# Patient Record
Sex: Male | Born: 1970 | Marital: Married
Health system: Southern US, Community
[De-identification: ages and names within clinical notes are randomized; demographics above are authoritative.]

## PROBLEM LIST (undated history)

## (undated) DIAGNOSIS — J9811 Atelectasis: Secondary | ICD-10-CM

## (undated) DIAGNOSIS — A419 Sepsis, unspecified organism: Secondary | ICD-10-CM

## (undated) DIAGNOSIS — J181 Lobar pneumonia, unspecified organism: Secondary | ICD-10-CM

## (undated) DIAGNOSIS — R6520 Severe sepsis without septic shock: Secondary | ICD-10-CM

## (undated) DIAGNOSIS — G7100 Muscular dystrophy, unspecified: Secondary | ICD-10-CM

## (undated) DIAGNOSIS — J9621 Acute and chronic respiratory failure with hypoxia: Secondary | ICD-10-CM

---

## 2018-04-30 ENCOUNTER — Inpatient Hospital Stay
Admission: RE | Admit: 2018-04-30 | Discharge: 2018-05-27 | Disposition: A | Discharge status: Home Health | Payer: Medicare Other | Source: Ambulatory Visit | Attending: Internal Medicine | Admitting: Internal Medicine

## 2018-04-30 ENCOUNTER — Other Ambulatory Visit (HOSPITAL_COMMUNITY): Payer: Self-pay

## 2018-04-30 DIAGNOSIS — T85598A Other mechanical complication of other gastrointestinal prosthetic devices, implants and grafts, initial encounter: Secondary | ICD-10-CM

## 2018-04-30 DIAGNOSIS — A419 Sepsis, unspecified organism: Secondary | ICD-10-CM | POA: Diagnosis present

## 2018-04-30 DIAGNOSIS — J9690 Respiratory failure, unspecified, unspecified whether with hypoxia or hypercapnia: Secondary | ICD-10-CM

## 2018-04-30 DIAGNOSIS — J90 Pleural effusion, not elsewhere classified: Secondary | ICD-10-CM

## 2018-04-30 DIAGNOSIS — J9621 Acute and chronic respiratory failure with hypoxia: Secondary | ICD-10-CM | POA: Diagnosis present

## 2018-04-30 DIAGNOSIS — R6520 Severe sepsis without septic shock: Secondary | ICD-10-CM

## 2018-04-30 DIAGNOSIS — K567 Ileus, unspecified: Secondary | ICD-10-CM

## 2018-04-30 DIAGNOSIS — Z931 Gastrostomy status: Secondary | ICD-10-CM

## 2018-04-30 DIAGNOSIS — J9811 Atelectasis: Secondary | ICD-10-CM

## 2018-04-30 DIAGNOSIS — J181 Lobar pneumonia, unspecified organism: Secondary | ICD-10-CM | POA: Diagnosis present

## 2018-04-30 DIAGNOSIS — J189 Pneumonia, unspecified organism: Secondary | ICD-10-CM

## 2018-04-30 DIAGNOSIS — G7100 Muscular dystrophy, unspecified: Secondary | ICD-10-CM | POA: Diagnosis present

## 2018-04-30 DIAGNOSIS — J111 Influenza due to unidentified influenza virus with other respiratory manifestations: Secondary | ICD-10-CM

## 2018-04-30 HISTORY — DX: Sepsis, unspecified organism: A41.9

## 2018-04-30 HISTORY — DX: Muscular dystrophy, unspecified: G71.00

## 2018-04-30 HISTORY — DX: Atelectasis: J98.11

## 2018-04-30 HISTORY — DX: Sepsis, unspecified organism: R65.20

## 2018-04-30 HISTORY — DX: Lobar pneumonia, unspecified organism: J18.1

## 2018-04-30 HISTORY — DX: Acute and chronic respiratory failure with hypoxia: J96.21

## 2018-04-30 MED ORDER — IOPAMIDOL (ISOVUE-300) INJECTION 61%
INTRAVENOUS | Status: AC
  Administered 2018-04-30: 50 mL via GASTROSTOMY
  Filled 2018-04-30: qty 50

## 2018-05-01 ENCOUNTER — Other Ambulatory Visit (HOSPITAL_COMMUNITY): Payer: Self-pay

## 2018-05-01 ENCOUNTER — Encounter: Payer: Self-pay | Admitting: Internal Medicine

## 2018-05-01 DIAGNOSIS — J9621 Acute and chronic respiratory failure with hypoxia: Secondary | ICD-10-CM | POA: Diagnosis not present

## 2018-05-01 DIAGNOSIS — J9811 Atelectasis: Secondary | ICD-10-CM | POA: Diagnosis not present

## 2018-05-01 DIAGNOSIS — J181 Lobar pneumonia, unspecified organism: Secondary | ICD-10-CM | POA: Diagnosis not present

## 2018-05-01 DIAGNOSIS — A419 Sepsis, unspecified organism: Secondary | ICD-10-CM | POA: Diagnosis present

## 2018-05-01 DIAGNOSIS — R6520 Severe sepsis without septic shock: Secondary | ICD-10-CM

## 2018-05-01 DIAGNOSIS — G7100 Muscular dystrophy, unspecified: Secondary | ICD-10-CM | POA: Diagnosis not present

## 2018-05-01 LAB — CBC WITH DIFFERENTIAL/PLATELET
Abs Immature Granulocytes: 0.06 10*3/uL (ref 0.00–0.07)
BASOS PCT: 0 %
Basophils Absolute: 0 10*3/uL (ref 0.0–0.1)
EOS ABS: 0.3 10*3/uL (ref 0.0–0.5)
Eosinophils Relative: 3 %
HCT: 40.9 % (ref 39.0–52.0)
Hemoglobin: 13 g/dL (ref 13.0–17.0)
Immature Granulocytes: 1 %
Lymphocytes Relative: 15 %
Lymphs Abs: 1.4 10*3/uL (ref 0.7–4.0)
MCH: 29.9 pg (ref 26.0–34.0)
MCHC: 31.8 g/dL (ref 30.0–36.0)
MCV: 94 fL (ref 80.0–100.0)
Monocytes Absolute: 0.7 10*3/uL (ref 0.1–1.0)
Monocytes Relative: 7 %
Neutro Abs: 7 10*3/uL (ref 1.7–7.7)
Neutrophils Relative %: 74 %
PLATELETS: 354 10*3/uL (ref 150–400)
RBC: 4.35 MIL/uL (ref 4.22–5.81)
RDW: 13.1 % (ref 11.5–15.5)
WBC: 9.5 10*3/uL (ref 4.0–10.5)
nRBC: 0 % (ref 0.0–0.2)

## 2018-05-01 LAB — COMPREHENSIVE METABOLIC PANEL
ALT: 29 U/L (ref 0–44)
AST: 19 U/L (ref 15–41)
Albumin: 3 g/dL — ABNORMAL LOW (ref 3.5–5.0)
Alkaline Phosphatase: 76 U/L (ref 38–126)
Anion gap: 15 (ref 5–15)
BUN: 6 mg/dL (ref 6–20)
CHLORIDE: 103 mmol/L (ref 98–111)
CO2: 20 mmol/L — ABNORMAL LOW (ref 22–32)
Calcium: 9.2 mg/dL (ref 8.9–10.3)
Creatinine, Ser: 0.41 mg/dL — ABNORMAL LOW (ref 0.61–1.24)
GFR calc Af Amer: >60 mL/min (ref >60)
GFR calc non Af Amer: >60 mL/min (ref >60)
Glucose, Bld: 123 mg/dL — ABNORMAL HIGH (ref 70–99)
Potassium: 3.8 mmol/L (ref 3.5–5.1)
Sodium: 138 mmol/L (ref 135–145)
Total Bilirubin: 1.4 mg/dL — ABNORMAL HIGH (ref 0.3–1.2)
Total Protein: 6.6 g/dL (ref 6.5–8.1)

## 2018-05-01 LAB — C DIFFICILE QUICK SCREEN W PCR REFLEX
C Diff antigen: NEGATIVE
C Diff interpretation: NOT DETECTED
C Diff toxin: NEGATIVE

## 2018-05-01 LAB — BLOOD GAS, ARTERIAL
Acid-base deficit: 3.1 mmol/L — ABNORMAL HIGH (ref 0.0–2.0)
Bicarbonate: 20.4 mmol/L (ref 20.0–28.0)
Drawn by: 347191
FIO2: 0.3
MECHVT: 380 mL
O2 Saturation: 98.1 %
PEEP: 5 cmH2O
Patient temperature: 98.6
RATE: 14 resp/min
pCO2 arterial: 30.5 mmHg — ABNORMAL LOW (ref 32.0–48.0)
pH, Arterial: 7.44 (ref 7.350–7.450)
pO2, Arterial: 104 mmHg (ref 83.0–108.0)

## 2018-05-01 LAB — PROTIME-INR
INR: 1.18
Prothrombin Time: 14.9 seconds (ref 11.4–15.2)

## 2018-05-01 LAB — MAGNESIUM: MAGNESIUM: 2.1 mg/dL (ref 1.7–2.4)

## 2018-05-01 LAB — PHOSPHORUS: PHOSPHORUS: 3.2 mg/dL (ref 2.5–4.6)

## 2018-05-01 MED ORDER — FIRST-LANSOPRAZOLE 3 MG/ML PO SUSP
30.00 | ORAL | Status: DC
Start: 2018-05-01 — End: 2018-05-01

## 2018-05-01 MED ORDER — GENERIC EXTERNAL MEDICATION
650.00 | Status: DC
Start: ? — End: 2018-05-01

## 2018-05-01 MED ORDER — PHENOL 1.4 % MT LIQD
2.00 | OROMUCOSAL | Status: DC
Start: ? — End: 2018-05-01

## 2018-05-01 MED ORDER — ALUM & MAG HYDROXIDE-SIMETH 200-200-20 MG/5ML PO SUSP
30.00 | ORAL | Status: DC
Start: ? — End: 2018-05-01

## 2018-05-01 MED ORDER — GENERIC EXTERNAL MEDICATION
15.00 | Status: DC
Start: ? — End: 2018-05-01

## 2018-05-01 MED ORDER — NITROGLYCERIN 0.4 MG SL SUBL
.40 | SUBLINGUAL_TABLET | SUBLINGUAL | Status: DC
Start: ? — End: 2018-05-01

## 2018-05-01 MED ORDER — MORPHINE SULFATE (PF) 4 MG/ML IV SOLN
4.00 | INTRAVENOUS | Status: DC
Start: ? — End: 2018-05-01

## 2018-05-01 MED ORDER — ENOXAPARIN SODIUM 40 MG/0.4ML ~~LOC~~ SOLN
40.00 | SUBCUTANEOUS | Status: DC
Start: 2018-04-29 — End: 2018-05-01

## 2018-05-01 MED ORDER — HYDRALAZINE HCL 20 MG/ML IJ SOLN
10.00 | INTRAMUSCULAR | Status: DC
Start: ? — End: 2018-05-01

## 2018-05-01 MED ORDER — DEXTROSE IN LACTATED RINGERS 5 % IV SOLN
50.00 | INTRAVENOUS | Status: DC
Start: ? — End: 2018-05-01

## 2018-05-01 MED ORDER — INSULIN LISPRO 100 UNIT/ML ~~LOC~~ SOLN
1.00 | SUBCUTANEOUS | Status: DC
Start: 2018-04-30 — End: 2018-05-01

## 2018-05-01 MED ORDER — POLYETHYLENE GLYCOL 3350 17 G PO PACK
17.00 | PACK | ORAL | Status: DC
Start: 2018-05-01 — End: 2018-05-01

## 2018-05-01 MED ORDER — GENERIC EXTERNAL MEDICATION
10.00 | Status: DC
Start: ? — End: 2018-05-01

## 2018-05-01 MED ORDER — INSULIN GLARGINE 100 UNIT/ML ~~LOC~~ SOLN
1.00 | SUBCUTANEOUS | Status: DC
Start: 2018-04-30 — End: 2018-05-01

## 2018-05-01 MED ORDER — LORAZEPAM 2 MG/ML IJ SOLN
.50 | INTRAMUSCULAR | Status: DC
Start: ? — End: 2018-05-01

## 2018-05-01 MED ORDER — GENERIC EXTERNAL MEDICATION
1.00 | Status: DC
Start: ? — End: 2018-05-01

## 2018-05-01 MED ORDER — LOPERAMIDE HCL 2 MG PO CAPS
2.00 | ORAL_CAPSULE | ORAL | Status: DC
Start: ? — End: 2018-05-01

## 2018-05-01 MED ORDER — HYDROCORTISONE 1 % EX CREA
TOPICAL_CREAM | CUTANEOUS | Status: DC
Start: 2018-04-28 — End: 2018-05-01

## 2018-05-01 MED ORDER — ALBUTEROL SULFATE (2.5 MG/3ML) 0.083% IN NEBU
2.50 | INHALATION_SOLUTION | RESPIRATORY_TRACT | Status: DC
Start: ? — End: 2018-05-01

## 2018-05-01 MED ORDER — GUAIFENESIN 400 MG PO TABS
400.00 | ORAL_TABLET | ORAL | Status: DC
Start: ? — End: 2018-05-01

## 2018-05-01 MED ORDER — SODIUM CHLORIDE 0.9 % IV SOLN
10.00 | INTRAVENOUS | Status: DC
Start: ? — End: 2018-05-01

## 2018-05-01 MED ORDER — LACTULOSE 10 GM/15ML PO SOLN
30.00 | ORAL | Status: DC
Start: 2018-04-30 — End: 2018-05-01

## 2018-05-01 MED ORDER — ACETAMINOPHEN 325 MG PO TABS
650.00 | ORAL_TABLET | ORAL | Status: DC
Start: ? — End: 2018-05-01

## 2018-05-01 MED ORDER — INSULIN GLARGINE 100 UNIT/ML ~~LOC~~ SOLN
1.00 | SUBCUTANEOUS | Status: DC
Start: ? — End: 2018-05-01

## 2018-05-01 MED ORDER — DOCUSATE SODIUM 150 MG/15ML PO LIQD
100.00 | ORAL | Status: DC
Start: 2018-05-01 — End: 2018-05-01

## 2018-05-01 MED ORDER — MORPHINE SULFATE (PF) 2 MG/ML IV SOLN
2.00 | INTRAVENOUS | Status: DC
Start: ? — End: 2018-05-01

## 2018-05-01 MED ORDER — LABETALOL HCL 5 MG/ML IV SOLN
20.00 | INTRAVENOUS | Status: DC
Start: ? — End: 2018-05-01

## 2018-05-01 MED ORDER — CITALOPRAM HYDROBROMIDE 20 MG PO TABS
10.00 | ORAL_TABLET | ORAL | Status: DC
Start: 2018-04-29 — End: 2018-05-01

## 2018-05-01 MED ORDER — POLYETHYLENE GLYCOL 3350 17 G PO PACK
17.00 | PACK | ORAL | Status: DC
Start: ? — End: 2018-05-01

## 2018-05-01 MED ORDER — PRAVASTATIN SODIUM 20 MG PO TABS
20.00 | ORAL_TABLET | ORAL | Status: DC
Start: 2018-04-29 — End: 2018-05-01

## 2018-05-01 MED ORDER — INSULIN LISPRO 100 UNIT/ML ~~LOC~~ SOLN
1.00 | SUBCUTANEOUS | Status: DC
Start: ? — End: 2018-05-01

## 2018-05-01 MED ORDER — THERA PO TABS
1.00 | ORAL_TABLET | ORAL | Status: DC
Start: 2018-04-29 — End: 2018-05-01

## 2018-05-01 MED ORDER — HYDROMORPHONE HCL 1 MG/ML IJ SOLN
1.00 | INTRAMUSCULAR | Status: DC
Start: ? — End: 2018-05-01

## 2018-05-01 NOTE — Consult Note (Signed)
[facility]  Date of Service: 05/01/2018  PULMONARY CRITICAL CARE CONSULT   Joseph ShockDarrin L Hampton  WJX:[id number]  DOB: 26-Nov-1970   DOA: 04/30/2018  Referring Physician: Carron CurieAli Hijazi, MD  HPI: Joseph Hampton is a 48 y.o. male seen for follow up of Acute on Chronic Respiratory Failure.  Patient has a longstanding history of muscular dystrophy hyperlipidemia hypertension who presented to the outside hospital with complaints of increasing fever chills cough congestion and bringing up of yellow sputum.  He had been ill for approximately 3 days prior.  Should be noted that he does have a home ventilator that he uses.  He is never had a tracheostomy before he is never been intubated before according to his wife.  Patient has been using the Avva's at home when he sleeps to help with his respiratory muscles.  At the time of presentation he was nontoxic-appearing had low-grade fever noted at that time.  He was initially on 10 L nasal cannula however desaturated to the 70s and therefore had to be intubated.  His hospital course was as follows.  He apparently was found to have atelectasis of the left lung.  Also he had gram-negative pneumonia which he was treated for.  Patient had a tracheostomy done.  This was done for anticipated prolonged mechanical ventilation in addition they did place a PEG tube at that time.  At the time that he is seen he is doing well.  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  Review of Systems:  ROS performed and is unremarkable other than noted above.  Past Medical History:  Diagnosis Date  . Hyperlipidemia  . Hypertension  . MD (muscular dystrophy) (*)  . Psoriasis  . Tachycardia   Past Surgical History:  Procedure Laterality Date  . Colonoscopy  [facility]  . Femur fx  . Male torsion  . Muscle biopsy   . Nerve graph  . Tonsillectomy  . Upper gastrointestinal endoscopy 2014   Family History  Problem Relation Age of Onset  . Hyperlipidemia Mother  . Hypertension Mother  . Heart disease Father   Social History   Socioeconomic History  . Marital status: Married  Spouse name: Not on file  . Number of children: Not on file  . Years of education: Not on file  . Highest education level: Not on file  Occupational History  . Not on file  Social Needs  . Financial resource strain: Not on file  . Food insecurity  Worry: Not on file  Inability: Not on file  . Transportation needs  Medical: Not on file  Non-medical: Not on file  Tobacco Use  . Smoking status: Never Smoker  . Smokeless tobacco: Never Used  Substance and Sexual Activity  . Alcohol use: No  . Drug use: No    Allergies erythromycin  Medications: Reviewed on Rounds  Physical Exam:  Vitals: Temperature 97.0 pulse 81 respiratory 26 blood pressure 143/83 saturations 98%  Ventilator Settings mode of ventilation pressure support FiO2 28% tidal volume 326 PEEP 5 pressure support 12  . General: Comfortable at this time . Eyes: Grossly normal lids, irises & conjunctiva . ENT: grossly tongue is normal . Neck: no obvious mass . Cardiovascular: S1-S2 normal no gallop or rub . Respiratory: No rhonchi expansion equal . Abdomen: Soft nontender . Skin:  no rash seen on limited exam . Musculoskeletal: not rigid . Psychiatric:unable to assess . Neurologic: no seizure no involuntary movements         Labs on Admission:  Basic Metabolic Panel: Recent Labs  Lab 05/01/18 0545  NA 138  K 3.8  CL 103  CO2 20*  GLUCOSE 123*  BUN 6  CREATININE 0.41*  CALCIUM 9.2  MG 2.1  PHOS 3.2    Recent Labs  Lab 05/01/18 0505  PHART 7.440  PCO2ART 30.5*  PO2ART 104  HCO3 20.4  O2SAT 98.1    Liver Function Tests: Recent Labs  Lab 05/01/18 0545  AST 19  ALT 29  ALKPHOS 76  BILITOT 1.4*  PROT 6.6  ALBUMIN 3.0*    No results for input(s): LIPASE, AMYLASE in the last 168 hours. No results for input(s): AMMONIA in the last 168 hours.  CBC: Recent Labs  Lab 05/01/18 0545  WBC 9.5  NEUTROABS 7.0  HGB 13.0  HCT 40.9  MCV 94.0  PLT 354    Cardiac Enzymes: No results for input(s): CKTOTAL, CKMB, CKMBINDEX, TROPONINI in the last 168 hours.  BNP (last 3 results) No results for input(s): BNP in the last 8760 hours.  ProBNP (last 3 results) No results for input(s): PROBNP in the last 8760 hours.   Radiological Exams on Admission: Dg Abdomen Peg Tube Location  Result Date: 04/30/2018 CLINICAL DATA:  Peg tube placement EXAM: ABDOMEN - 1 VIEW COMPARISON:  12/30/2014 FINDINGS: Contrast was injected through the gastrostomy tube and an image was obtained. Contrast opacifies the gastric lumen, duodenal sweep and proximal jejunal loops. Intragastric position of the PEG tube is confirmed. No contrast extravasation. IMPRESSION: Peg tube is located within the distal stomach. Electronically Signed   By: Ulyses SouthwardMark  Boles M.D.   On: 04/30/2018 17:45   Dg Chest Port 1 View  Result Date: 05/01/2018 CLINICAL DATA:  Respiratory failure EXAM: PORTABLE CHEST 1 VIEW COMPARISON:  None. FINDINGS: Tracheostomy tube in place. There is extensive artifact from hardware. Right IJ line with tip at the upper right atrium. Low volume chest with marked elevation of the left diaphragm with subjacent gas distended stomach (stomach based on KUB yesterday). Haziness at the left base. No visible pneumothorax. Cardiopericardial enlargement. IMPRESSION: 1. Low volume chest with asymmetric volume loss on the left where there could be lobar collapse. On outside chest CT 04/27/2018 there was left lower lobe collapse. 2. Cardiopericardial enlargement. 3. Gaseous distention of the stomach. Electronically Signed   By: Marnee SpringJonathon  Watts M.D.   On: 05/01/2018 07:41    Assessment/Plan Active Problems:   Acute on chronic respiratory failure with  hypoxia (HCC)   Lobar pneumonia (HCC)   Atelectasis of left lung   Severe sepsis (HCC)   Muscular dystrophy (HCC)   1. Acute on chronic respiratory failure with hypoxia at this time patient has been started on a wean he is weaning on pressure support mode and currently is requiring 28% FiO2.  We will continue to advance his wean as tolerated.  Ultimately the goal is to get him back to his baseline however I am not sure if he has had underlying progression of his muscular distal atrophy during this illness which may make it more difficult for him to wean. 2. Lobar pneumonia patient has had lobar pneumonia which was treated with antibiotics we will continue to monitor radiologically.  Last chest x-ray showed low volumes with some volume loss on the left side he has had prior atelectasis. 3.  Atelectasis of the left lower lobe patient has had bronchoscopies multiple times because of this we will need to continue to monitor and keep a close eye on this. 4. Sepsis with sepsis Hampton hemodynamically he is stable at this time we will continue with supportive care. 5. Muscular dystrophy physical therapy as tolerated overall prognosis is guarded  I have personally seen and evaluated the patient, evaluated laboratory and imaging results, formulated the assessment and plan and placed orders. The Patient requires high complexity decision making for assessment and support.  Case was discussed on Rounds with the Respiratory Therapy Staff Time Spent  Yevonne Pax, MD [facility] Pulmonary Critical Care Medicine Sleep Medicine

## 2018-05-02 ENCOUNTER — Other Ambulatory Visit (HOSPITAL_COMMUNITY): Payer: Self-pay

## 2018-05-02 DIAGNOSIS — J9621 Acute and chronic respiratory failure with hypoxia: Secondary | ICD-10-CM | POA: Diagnosis not present

## 2018-05-02 DIAGNOSIS — J181 Lobar pneumonia, unspecified organism: Secondary | ICD-10-CM | POA: Diagnosis not present

## 2018-05-02 DIAGNOSIS — J9811 Atelectasis: Secondary | ICD-10-CM | POA: Diagnosis not present

## 2018-05-02 DIAGNOSIS — G7100 Muscular dystrophy, unspecified: Secondary | ICD-10-CM | POA: Diagnosis not present

## 2018-05-02 NOTE — Progress Notes (Signed)
Pulmonary Critical Care Medicine [facility] [place]   PULMONARY CRITICAL CARE SERVICE  PROGRESS NOTE  Date of Service: 05/02/2018  Joseph Hampton  [id number]  DOB: 17-Jul-1970   DOA: 04/30/2018  Referring Physician: Carron Curie, MD  HPI: Joseph Hampton is a 48 y.o. male seen for follow up of Acute on Chronic Respiratory Failure.  Patient is on pressure support mode at this time.  He is actually doing fairly well.  I had a very lengthy discussion with the patient and the patient's wife.  They are very realistic and expectations and understand that he will need ongoing nocturnal support.  The patient had already been on a AVAPS at home and they are now ready to use that by the tracheostomy.  It would be better to leave the tracheostomy in place because of his secretion concerns  Medications: Reviewed on Rounds  Physical Exam:  Vitals: Temperature 97.7 pulse 62 respiratory 19 blood pressure 154/86 saturations 98%  Ventilator Settings currently on pressure support FiO2 28% tidal volume 310 pressure support 12 PEEP 5  . General: Comfortable at this time . Eyes: Grossly normal lids, irises & conjunctiva . ENT: grossly tongue is normal . Neck: no obvious mass . Cardiovascular: S1 S2 normal no gallop . Respiratory: Coarse rhonchi are noted bilaterally . Abdomen: soft . Skin: no rash seen on limited exam . Musculoskeletal: not rigid . Psychiatric:unable to assess . Neurologic: no seizure no involuntary movements         Lab Data:   Basic Metabolic Panel: Recent Labs  Lab 05/01/18 0545  NA 138  K 3.8  CL 103  CO2 20*  GLUCOSE 123*  BUN 6  CREATININE 0.41*  CALCIUM 9.2  MG 2.1  PHOS 3.2    ABG: Recent Labs  Lab 05/01/18 0505  PHART 7.440  PCO2ART 30.5*  PO2ART 104  HCO3 20.4  O2SAT 98.1    Liver Function Tests: Recent Labs  Lab 05/01/18 0545  AST 19  ALT 29  ALKPHOS 76  BILITOT 1.4*  PROT 6.6  ALBUMIN 3.0*   No results for input(s): LIPASE,  AMYLASE in the last 168 hours. No results for input(s): AMMONIA in the last 168 hours.  CBC: Recent Labs  Lab 05/01/18 0545  WBC 9.5  NEUTROABS 7.0  HGB 13.0  HCT 40.9  MCV 94.0  PLT 354    Cardiac Enzymes: No results for input(s): CKTOTAL, CKMB, CKMBINDEX, TROPONINI in the last 168 hours.  BNP (last 3 results) No results for input(s): BNP in the last 8760 hours.  ProBNP (last 3 results) No results for input(s): PROBNP in the last 8760 hours.  Radiological Exams: Dg Abdomen Peg Tube Location  Result Date: 04/30/2018 CLINICAL DATA:  Peg tube placement EXAM: ABDOMEN - 1 VIEW COMPARISON:  12/30/2014 FINDINGS: Contrast was injected through the gastrostomy tube and an image was obtained. Contrast opacifies the gastric lumen, duodenal sweep and proximal jejunal loops. Intragastric position of the PEG tube is confirmed. No contrast extravasation. IMPRESSION: Peg tube is located within the distal stomach. Electronically Signed   By: Ulyses Southward M.D.   On: 04/30/2018 17:45   Dg Chest Port 1 View  Result Date: 05/01/2018 CLINICAL DATA:  Respiratory failure EXAM: PORTABLE CHEST 1 VIEW COMPARISON:  None. FINDINGS: Tracheostomy tube in place. There is extensive artifact from hardware. Right IJ line with tip at the upper right atrium. Low volume chest with marked elevation of the left diaphragm with subjacent gas distended stomach (stomach based  on KUB yesterday). Haziness at the left base. No visible pneumothorax. Cardiopericardial enlargement. IMPRESSION: 1. Low volume chest with asymmetric volume loss on the left where there could be lobar collapse. On outside chest CT 04/27/2018 there was left lower lobe collapse. 2. Cardiopericardial enlargement. 3. Gaseous distention of the stomach. Electronically Signed   By: Marnee SpringJonathon  Watts M.D.   On: 05/01/2018 07:41   Dg Abd Portable 1v  Result Date: 05/02/2018 CLINICAL DATA:  Ileus EXAM: PORTABLE ABDOMEN - 1 VIEW COMPARISON:  04/30/2018 abdominal  radiograph FINDINGS: Tip of percutaneous gastrostomy tube overlies the body of the stomach. Mild gaseous distention of the visualized stomach. No dilated small bowel loops. Enteric contrast is noted throughout the colon and rectum. Moderate diffuse gaseous distention of the large bowel. No evidence of pneumatosis or pneumoperitoneum. No radiopaque nephrolithiasis. IMPRESSION: 1. Tip of percutaneous gastrostomy tube overlies the body of the stomach. Mild gaseous distention of the stomach. 2. Nonobstructive bowel gas pattern. 3. Moderate diffuse gaseous distention of the large bowel. Enteric contrast is seen in the large bowel and rectum. No evidence of free air on these supine views. Electronically Signed   By: Delbert PhenixJason A Poff M.D.   On: 05/02/2018 08:32    Assessment/Plan Active Problems:   Acute on chronic respiratory failure with hypoxia (HCC)   Lobar pneumonia (HCC)   Atelectasis of left lung   Severe sepsis (HCC)   Muscular dystrophy (HCC)   1. Acute on chronic respiratory failure with hypoxia we will continue with weaning on pressure support patient is tolerating it well.  Secretions are improving patient was started on Mucomyst. 2. Lobar pneumonia treated clinically improved. 3. Atelectasis of the lung.  He has a large amount of gas in his belly and colon and stomach which is pushing up against his right diaphragm so therefore the left lung is hypoexpanded.  We will continue with the Mucomyst continue with chest PT which was requested today 4. Severe sepsis resolved 5. Muscular dystrophy patient has progressive disease we will continue with supportive care   I have personally seen and evaluated the patient, evaluated laboratory and imaging results, formulated the assessment and plan and placed orders.  Time 35 minutes extensive discussion with the patient and family The Patient requires high complexity decision making for assessment and support.  Case was discussed on Rounds with the  Respiratory Therapy Staff  Yevonne PaxSaadat A , MD [facility] Pulmonary Critical Care Medicine Sleep Medicine

## 2018-05-03 DIAGNOSIS — G7100 Muscular dystrophy, unspecified: Secondary | ICD-10-CM | POA: Diagnosis not present

## 2018-05-03 DIAGNOSIS — J9621 Acute and chronic respiratory failure with hypoxia: Secondary | ICD-10-CM | POA: Diagnosis not present

## 2018-05-03 DIAGNOSIS — J9811 Atelectasis: Secondary | ICD-10-CM | POA: Diagnosis not present

## 2018-05-03 DIAGNOSIS — J181 Lobar pneumonia, unspecified organism: Secondary | ICD-10-CM | POA: Diagnosis not present

## 2018-05-03 NOTE — Progress Notes (Signed)
[facility]   PULMONARY CRITICAL CARE SERVICE  PROGRESS NOTE  Date of Service: 05/03/2018  Joseph Hampton  [id number]  DOB: 08/23/1970   DOA: 04/30/2018  Referring Physician: Carron CurieAli Hijazi, MD  HPI: Joseph Hampton is a 48 y.o. male seen for follow up of Acute on Chronic Respiratory Failure.  Patient at this time is weaning on pressure support the goal is for approximately 12 hours  Medications: Reviewed on Rounds  Physical Exam:  Vitals: Temperature 97.4 pulse 100 respiratory rate 30 blood pressure 106/68 saturations 98%  Ventilator Settings mode ventilation pressure support FiO2 28% tidal volume 400 pressure 12 PEEP 5  . General: Comfortable at this time . Eyes: Grossly normal lids, irises & conjunctiva . ENT: grossly tongue is normal . Neck: no obvious mass . Cardiovascular: S1 S2 normal no gallop . Respiratory: No rhonchi or rales are noted at this time . Abdomen: soft . Skin: no rash seen on limited exam . Musculoskeletal: not rigid . Psychiatric:unable to assess . Neurologic: no seizure no involuntary movements         Lab Data:   Basic Metabolic Panel: Recent Labs  Lab 05/01/18 0545  NA 138  K 3.8  CL 103  CO2 20*  GLUCOSE 123*  BUN 6  CREATININE 0.41*  CALCIUM 9.2  MG 2.1  PHOS 3.2    ABG: Recent Labs  Lab 05/01/18 0505  PHART 7.440  PCO2ART 30.5*  PO2ART 104  HCO3 20.4  O2SAT 98.1    Liver Function Tests: Recent Labs  Lab 05/01/18 0545  AST 19  ALT 29  ALKPHOS 76  BILITOT 1.4*  PROT 6.6  ALBUMIN 3.0*   No results for input(s): LIPASE, AMYLASE in the last 168 hours. No results for input(s): AMMONIA in the last 168 hours.  CBC: Recent Labs  Lab 05/01/18 0545  WBC 9.5  NEUTROABS 7.0  HGB 13.0  HCT 40.9  MCV 94.0  PLT 354    Cardiac Enzymes: No results for input(s): CKTOTAL, CKMB, CKMBINDEX, TROPONINI in the last 168 hours.  BNP (last 3 results) No results for  input(s): BNP in the last 8760 hours.  ProBNP (last 3 results) No results for input(s): PROBNP in the last 8760 hours.  Radiological Exams: Dg Abd Portable 1v  Result Date: 05/02/2018 CLINICAL DATA:  Ileus EXAM: PORTABLE ABDOMEN - 1 VIEW COMPARISON:  04/30/2018 abdominal radiograph FINDINGS: Tip of percutaneous gastrostomy tube overlies the body of the stomach. Mild gaseous distention of the visualized stomach. No dilated small bowel loops. Enteric contrast is noted throughout the colon and rectum. Moderate diffuse gaseous distention of the large bowel. No evidence of pneumatosis or pneumoperitoneum. No radiopaque nephrolithiasis. IMPRESSION: 1. Tip of percutaneous gastrostomy tube overlies the body of the stomach. Mild gaseous distention of the stomach. 2. Nonobstructive bowel gas pattern. 3. Moderate diffuse gaseous distention of the large bowel. Enteric contrast is seen in the large bowel and rectum. No evidence of free air on these supine views. Electronically Signed   By: Delbert PhenixJason A Poff M.D.   On: 05/02/2018 08:32    Assessment/Plan Active Problems:   Acute on chronic respiratory failure with hypoxia (HCC)   Lobar pneumonia (HCC)   Atelectasis of left lung   Severe sepsis (HCC)   Muscular dystrophy (HCC)   1. Acute on chronic respiratory failure with hypoxia we will continue with the wean as scheduled for 12 hours today.  Ultimate goal is to get him on T  collar during the daytime and ventilator at nighttime 2. Lobar pneumonia treated we will continue present management 3. Atelectasis of the lung we will continue to monitor radiologically continue aggressive pulmonary toilet chest PT as well as Mucomyst 4. Severe sepsis at baseline we will continue supportive care hemodynamically stable 5. Muscular dystrophy advanced progressive disease continue to monitor   I have personally seen and evaluated the patient, evaluated laboratory and imaging results, formulated the assessment and plan and  placed orders. The Patient requires high complexity decision making for assessment and support.  Case was discussed on Rounds with the Respiratory Therapy Staff  Yevonne Pax, MD [facility] Pulmonary Critical Care Medicine Sleep Medicine

## 2018-05-04 ENCOUNTER — Other Ambulatory Visit (HOSPITAL_COMMUNITY): Payer: Self-pay

## 2018-05-04 DIAGNOSIS — J181 Lobar pneumonia, unspecified organism: Secondary | ICD-10-CM | POA: Diagnosis not present

## 2018-05-04 DIAGNOSIS — J9811 Atelectasis: Secondary | ICD-10-CM | POA: Diagnosis not present

## 2018-05-04 DIAGNOSIS — J9621 Acute and chronic respiratory failure with hypoxia: Secondary | ICD-10-CM | POA: Diagnosis not present

## 2018-05-04 DIAGNOSIS — G7100 Muscular dystrophy, unspecified: Secondary | ICD-10-CM | POA: Diagnosis not present

## 2018-05-04 NOTE — Progress Notes (Signed)
Pulmonary Critical Care [facility] [place]   PULMONARY CRITICAL CARE SERVICE  PROGRESS NOTE  Date of Service: 05/04/2018  Joseph Hampton  [id number]  DOB: 1970/08/13   DOA: 04/30/2018  Referring Physician: Carron CurieAli Hijazi, MD  HPI: Joseph Hampton is a 48 y.o. male seen for follow up of Acute on Chronic Respiratory Failure.  Patient is on full support right now is supposed to wean today for pressure support of 16 hours  Medications: Reviewed on Rounds  Physical Exam:  Vitals: Temperature 97.0 pulse 76 respiratory 11 blood pressure 114/60 saturations 97%  Ventilator Settings mode ventilation assist control FiO2 28% tidal volume 800 PEEP 5  . General: Comfortable at this time . Eyes: Grossly normal lids, irises & conjunctiva . ENT: grossly tongue is normal . Neck: no obvious mass . Cardiovascular: S1 S2 normal no gallop . Respiratory: No rhonchi no rales are noted at this time . Abdomen: soft . Skin: no rash seen on limited exam . Musculoskeletal: not rigid . Psychiatric:unable to assess . Neurologic: no seizure no involuntary movements         Lab Data:   Basic Metabolic Panel: Recent Labs  Lab 05/01/18 0545  NA 138  K 3.8  CL 103  CO2 20*  GLUCOSE 123*  BUN 6  CREATININE 0.41*  CALCIUM 9.2  MG 2.1  PHOS 3.2    ABG: Recent Labs  Lab 05/01/18 0505  PHART 7.440  PCO2ART 30.5*  PO2ART 104  HCO3 20.4  O2SAT 98.1    Liver Function Tests: Recent Labs  Lab 05/01/18 0545  AST 19  ALT 29  ALKPHOS 76  BILITOT 1.4*  PROT 6.6  ALBUMIN 3.0*   No results for input(s): LIPASE, AMYLASE in the last 168 hours. No results for input(s): AMMONIA in the last 168 hours.  CBC: Recent Labs  Lab 05/01/18 0545  WBC 9.5  NEUTROABS 7.0  HGB 13.0  HCT 40.9  MCV 94.0  PLT 354    Cardiac Enzymes: No results for input(s): CKTOTAL, CKMB, CKMBINDEX, TROPONINI in the last 168 hours.  BNP (last 3 results) No results for input(s): BNP in the  last 8760 hours.  ProBNP (last 3 results) No results for input(s): PROBNP in the last 8760 hours.  Radiological Exams: Dg Abd Portable 1v  Result Date: 05/04/2018 CLINICAL DATA:  Ileus EXAM: PORTABLE ABDOMEN - 1 VIEW COMPARISON:  May 02, 2018 FINDINGS: There is generalized bowel dilatation without appreciable air-fluid levels. No evident free air. Gastrostomy catheter tip is in the region of the stomach. Postoperative changes noted in the proximal left femur. IMPRESSION: Generalized bowel dilatation in a pattern most likely due to ileus, stable. Gastrostomy catheter in region of stomach. No evident free air. Electronically Signed   By: Bretta BangWilliam  Woodruff III M.D.   On: 05/04/2018 07:49    Assessment/Plan Active Problems:   Acute on chronic respiratory failure with hypoxia (HCC)   Lobar pneumonia (HCC)   Atelectasis of left lung   Severe sepsis (HCC)   Muscular dystrophy (HCC)   1. Acute on chronic respiratory failure with hypoxia we will continue to advance As mentioned above patient is going to do pressure support going for 16 hours 2. Lobar pneumonia treated we will continue with supportive care 3. Atelectasis of the lung continue with pulmonary toilet supportive care 4. Severe sepsis resolved 5. Muscular dystrophy severe disease continue with present management therapy as tolerated   I have personally seen and evaluated the patient, evaluated laboratory  and imaging results, formulated the assessment and plan and placed orders. The Patient requires high complexity decision making for assessment and support.  Case was discussed on Rounds with the Respiratory Therapy Staff  Allyne Gee, MD [facility] Pulmonary Critical Care Medicine Sleep Medicine

## 2018-05-05 DIAGNOSIS — J181 Lobar pneumonia, unspecified organism: Secondary | ICD-10-CM | POA: Diagnosis not present

## 2018-05-05 DIAGNOSIS — J9811 Atelectasis: Secondary | ICD-10-CM | POA: Diagnosis not present

## 2018-05-05 DIAGNOSIS — J9621 Acute and chronic respiratory failure with hypoxia: Secondary | ICD-10-CM | POA: Diagnosis not present

## 2018-05-05 DIAGNOSIS — G7100 Muscular dystrophy, unspecified: Secondary | ICD-10-CM | POA: Diagnosis not present

## 2018-05-05 LAB — BASIC METABOLIC PANEL
Anion gap: 11 (ref 5–15)
BUN: 12 mg/dL (ref 6–20)
CO2: 24 mmol/L (ref 22–32)
Calcium: 8.8 mg/dL — ABNORMAL LOW (ref 8.9–10.3)
Chloride: 102 mmol/L (ref 98–111)
Creatinine, Ser: 0.37 mg/dL — ABNORMAL LOW (ref 0.61–1.24)
GFR calc Af Amer: >60 mL/min (ref >60)
GFR calc non Af Amer: >60 mL/min (ref >60)
Glucose, Bld: 93 mg/dL (ref 70–99)
Potassium: 3.1 mmol/L — ABNORMAL LOW (ref 3.5–5.1)
Sodium: 137 mmol/L (ref 135–145)

## 2018-05-05 LAB — CBC
HCT: 36.4 % — ABNORMAL LOW (ref 39.0–52.0)
Hemoglobin: 12.3 g/dL — ABNORMAL LOW (ref 13.0–17.0)
MCH: 30.8 pg (ref 26.0–34.0)
MCHC: 33.8 g/dL (ref 30.0–36.0)
MCV: 91 fL (ref 80.0–100.0)
Platelets: 223 10*3/uL (ref 150–400)
RBC: 4 MIL/uL — AB (ref 4.22–5.81)
RDW: 13.1 % (ref 11.5–15.5)
WBC: 5.8 10*3/uL (ref 4.0–10.5)
nRBC: 0 % (ref 0.0–0.2)

## 2018-05-05 LAB — MAGNESIUM: MAGNESIUM: 1.9 mg/dL (ref 1.7–2.4)

## 2018-05-05 NOTE — Progress Notes (Signed)
Pulmonary Critical Care Medicine [facility] [place]   PULMONARY CRITICAL CARE SERVICE  PROGRESS NOTE  Date of Service: 05/05/2018  Joseph Hampton  IRC:[id number]  DOB: July 18, 1970   DOA: 04/30/2018  Referring Physician: Carron Curie, MD  HPI: Joseph Hampton is a 48 y.o. male seen for follow up of Acute on Chronic Respiratory Failure.  This morning patient was attempted on spontaneous breathing trial but did not go well I placed him on a pressure support of 8/5 which he seemed to tolerated little bit better  Medications: Reviewed on Rounds  Physical Exam:  Vitals: Temperature 98.4 pulse 84 respiratory 22 blood pressure 124/65 saturations 100%  Ventilator Settings mode of ventilation pressure support FiO2 28% tidal volume 311 pressure support 8 PEEP 5  . General: Comfortable at this time . Eyes: Grossly normal lids, irises & conjunctiva . ENT: grossly tongue is normal . Neck: no obvious mass . Cardiovascular: S1 S2 normal no gallop . Respiratory: Coarse breath sounds with a few rhonchi . Abdomen: soft . Skin: no rash seen on limited exam . Musculoskeletal: not rigid . Psychiatric:unable to assess . Neurologic: no seizure no involuntary movements         Lab Data:   Basic Metabolic Panel: Recent Labs  Lab 05/01/18 0545 05/05/18 0541  NA 138 137  K 3.8 3.1*  CL 103 102  CO2 20* 24  GLUCOSE 123* 93  BUN 6 12  CREATININE 0.41* 0.37*  CALCIUM 9.2 8.8*  MG 2.1 1.9  PHOS 3.2  --     ABG: Recent Labs  Lab 05/01/18 0505  PHART 7.440  PCO2ART 30.5*  PO2ART 104  HCO3 20.4  O2SAT 98.1    Liver Function Tests: Recent Labs  Lab 05/01/18 0545  AST 19  ALT 29  ALKPHOS 76  BILITOT 1.4*  PROT 6.6  ALBUMIN 3.0*   No results for input(s): LIPASE, AMYLASE in the last 168 hours. No results for input(s): AMMONIA in the last 168 hours.  CBC: Recent Labs  Lab 05/01/18 0545 05/05/18 0541  WBC 9.5 5.8  NEUTROABS 7.0  --   HGB 13.0 12.3*  HCT 40.9  36.4*  MCV 94.0 91.0  PLT 354 223    Cardiac Enzymes: No results for input(s): CKTOTAL, CKMB, CKMBINDEX, TROPONINI in the last 168 hours.  BNP (last 3 results) No results for input(s): BNP in the last 8760 hours.  ProBNP (last 3 results) No results for input(s): PROBNP in the last 8760 hours.  Radiological Exams: Dg Abd Portable 1v  Result Date: 05/04/2018 CLINICAL DATA:  Ileus EXAM: PORTABLE ABDOMEN - 1 VIEW COMPARISON:  May 02, 2018 FINDINGS: There is generalized bowel dilatation without appreciable air-fluid levels. No evident free air. Gastrostomy catheter tip is in the region of the stomach. Postoperative changes noted in the proximal left femur. IMPRESSION: Generalized bowel dilatation in a pattern most likely due to ileus, stable. Gastrostomy catheter in region of stomach. No evident free air. Electronically Signed   By: Bretta Bang III M.D.   On: 05/04/2018 07:49    Assessment/Plan Active Problems:   Acute on chronic respiratory failure with hypoxia (HCC)   Lobar pneumonia (HCC)   Atelectasis of left lung   Severe sepsis (HCC)   Muscular dystrophy (HCC)   1. Acute on chronic respiratory failure with hypoxia we will continue with the pressure support 8/5 for weaning parameters.  Attempt T-bar if patient is able to tolerate today. 2. Lobar pneumonia treated patient had no fevers  noted. 3. Atelectasis of the lung continue with aggressive pulmonary toilet. 4. Severe sepsis resolved 5. Muscular dystrophy advanced disease we will continue with present management   I have personally seen and evaluated the patient, evaluated laboratory and imaging results, formulated the assessment and plan and placed orders. The Patient requires high complexity decision making for assessment and support.  Case was discussed on Rounds with the Respiratory Therapy Staff  Yevonne PaxSaadat A Keren Alverio, MD [facility] Pulmonary Critical Care Medicine Sleep Medicine

## 2018-05-06 ENCOUNTER — Other Ambulatory Visit (HOSPITAL_COMMUNITY): Payer: Self-pay

## 2018-05-06 DIAGNOSIS — J181 Lobar pneumonia, unspecified organism: Secondary | ICD-10-CM | POA: Diagnosis not present

## 2018-05-06 DIAGNOSIS — G7100 Muscular dystrophy, unspecified: Secondary | ICD-10-CM | POA: Diagnosis not present

## 2018-05-06 DIAGNOSIS — J9811 Atelectasis: Secondary | ICD-10-CM | POA: Diagnosis not present

## 2018-05-06 DIAGNOSIS — J9621 Acute and chronic respiratory failure with hypoxia: Secondary | ICD-10-CM | POA: Diagnosis not present

## 2018-05-06 LAB — BASIC METABOLIC PANEL
Anion gap: 14 (ref 5–15)
BUN: 12 mg/dL (ref 6–20)
CO2: 26 mmol/L (ref 22–32)
Calcium: 9.3 mg/dL (ref 8.9–10.3)
Chloride: 97 mmol/L — ABNORMAL LOW (ref 98–111)
Creatinine, Ser: 0.42 mg/dL — ABNORMAL LOW (ref 0.61–1.24)
GFR calc Af Amer: >60 mL/min (ref >60)
GFR calc non Af Amer: >60 mL/min (ref >60)
Glucose, Bld: 100 mg/dL — ABNORMAL HIGH (ref 70–99)
POTASSIUM: 3 mmol/L — AB (ref 3.5–5.1)
SODIUM: 137 mmol/L (ref 135–145)

## 2018-05-06 LAB — CBC
HCT: 41 % (ref 39.0–52.0)
Hemoglobin: 13.6 g/dL (ref 13.0–17.0)
MCH: 30.8 pg (ref 26.0–34.0)
MCHC: 33.2 g/dL (ref 30.0–36.0)
MCV: 92.8 fL (ref 80.0–100.0)
Platelets: 249 10*3/uL (ref 150–400)
RBC: 4.42 MIL/uL (ref 4.22–5.81)
RDW: 13.1 % (ref 11.5–15.5)
WBC: 6.4 10*3/uL (ref 4.0–10.5)
nRBC: 0 % (ref 0.0–0.2)

## 2018-05-06 NOTE — Progress Notes (Signed)
Pulmonary Critical Care Medicine [place]   PULMONARY CRITICAL CARE SERVICE  PROGRESS NOTE  Date of Service: 05/06/2018  Joseph Hampton  SFS:[id number]  DOB: [birth year]   DOA: 04/30/2018  Referring Physician: Merton Border, MD  HPI: Joseph Hampton is a 48 y.o. male seen for follow up of Acute on Chronic Respiratory Failure.  Currently patient is on T collar he is goal today was about 4 hours which he completed.  He did comfortably without distress  Medications: Reviewed on Rounds  Physical Exam:  Vitals: Temperature 97.6 pulse 77 respiratory 20 blood pressure 104/77 saturations 100%  Ventilator Settings off the ventilator on T collar  . General: Comfortable at this time . Eyes: Grossly normal lids, irises & conjunctiva . ENT: grossly tongue is normal . Neck: no obvious mass . Cardiovascular: S1 S2 normal no gallop . Respiratory: No rhonchi or rales are noted at this time . Abdomen: soft . Skin: no rash seen on limited exam . Musculoskeletal: not rigid . Psychiatric:unable to assess . Neurologic: no seizure no involuntary movements         Lab Data:   Basic Metabolic Panel: Recent Labs  Lab 05/01/18 0545 05/05/18 0541 05/06/18 0604  NA 138 137 137  K 3.8 3.1* 3.0*  CL 103 102 97*  CO2 20* 24 26  GLUCOSE 123* 93 100*  BUN _0 CREATININE 0.41* 0.37* 0.42*  CALCIUM 9.2 8.8* 9.3  MG 2.1 1.9  --   PHOS 3.2  --   --     ABG: Recent Labs  Lab 05/01/18 0505  PHART 7.440  PCO2ART 30.5*  PO2ART 104  HCO3 20.4  O2SAT 98.1    Liver Function Tests: Recent Labs  Lab 05/01/18 0545  AST 19  ALT 29  ALKPHOS 76  BILITOT 1.4*  PROT 6.6  ALBUMIN 3.0*   No results for input(s): LIPASE, AMYLASE in the last 168 hours. No results for input(s): AMMONIA in the last 168 hours.  CBC: Recent Labs  Lab 05/01/18 0545 05/05/18 0541 05/06/18 0604  WBC 9.5 5.8 6.4  NEUTROABS 7.0  --   --   HGB 13.0 12.3* 13.6  HCT 40.9 36.4* 41.0  MCV 94.0  91.0 92.8  PLT 354 223 249    Cardiac Enzymes: No results for input(s): CKTOTAL, CKMB, CKMBINDEX, TROPONINI in the last 168 hours.  BNP (last 3 results) No results for input(s): BNP in the last 8760 hours.  ProBNP (last 3 results) No results for input(s): PROBNP in the last 8760 hours.  Radiological Exams: Dg Chest Port 1 View  Result Date: 05/06/2018 CLINICAL DATA:  Collapse of left lung. EXAM: PORTABLE CHEST 1 VIEW COMPARISON:  One-view chest x-ray 05/01/2018 FINDINGS: Heart is enlarged. Tracheostomy tube is in place. Right IJ line is in place. There is improving aeration at the left base. Gastric or colonic distention previously noted has resolved. Left basilar airspace disease, likely atelectasis is improved. The right lung base is clear. IMPRESSION: 1. Improving aeration at the left base with decreased elevation of left hemidiaphragm. 2. Persistent but decreasing left base atelectasis and effusion. Electronically Signed   By: San Morelle M.D.   On: 05/06/2018 07:46   Dg Abd Portable 1v  Result Date: 05/06/2018 CLINICAL DATA:  Ileus EXAM: PORTABLE ABDOMEN - 1 VIEW COMPARISON:  05/04/2018 FINDINGS: Gastrostomy tube noted. There is improvement in the air distension of the small bowel. Colonic air distention remains, also slightly less. Sigmoid diverticulosis noted. Negative  for obstruction. No abnormal calcifications. Degenerative changes spine. IMPRESSION: Improvement in gaseous distention of bowel compatible resolving ileus. Electronically Signed   By: Jerilynn Mages.  Shick M.D.   On: 05/06/2018 07:43    Assessment/Plan Active Problems:   Acute on chronic respiratory failure with hypoxia (HCC)   Lobar pneumonia (HCC)   Atelectasis of left lung   Severe sepsis (HCC)   Muscular dystrophy ([place])   1. Acute on chronic respiratory failure with hypoxia we will continue weaning on the T collar.  Today's goal of 4 hours was met 2. Lobar pneumonia treated clinically improved chest x-ray  actually shows improvement in the atelectasis and effusions. 3. Atelectasis of left lung continue with aggressive pulmonary toilet supportive care it is improving on the chest film. 4. Severe sepsis resolved 5. Muscular dystrophy at baseline continue present management   I have personally seen and evaluated the patient, evaluated laboratory and imaging results, formulated the assessment and plan and placed orders. The Patient requires high complexity decision making for assessment and support.  Case was discussed on Rounds with the Respiratory Therapy Staff  Allyne Gee, MD [facility] Pulmonary Critical Care Medicine Sleep Medicine

## 2018-05-07 DIAGNOSIS — J9811 Atelectasis: Secondary | ICD-10-CM | POA: Diagnosis not present

## 2018-05-07 DIAGNOSIS — G7100 Muscular dystrophy, unspecified: Secondary | ICD-10-CM | POA: Diagnosis not present

## 2018-05-07 DIAGNOSIS — J181 Lobar pneumonia, unspecified organism: Secondary | ICD-10-CM | POA: Diagnosis not present

## 2018-05-07 DIAGNOSIS — J9621 Acute and chronic respiratory failure with hypoxia: Secondary | ICD-10-CM | POA: Diagnosis not present

## 2018-05-07 LAB — BASIC METABOLIC PANEL
Anion gap: 11 (ref 5–15)
BUN: 21 mg/dL — AB (ref 6–20)
CO2: 30 mmol/L (ref 22–32)
Calcium: 9.6 mg/dL (ref 8.9–10.3)
Chloride: 97 mmol/L — ABNORMAL LOW (ref 98–111)
Creatinine, Ser: 0.44 mg/dL — ABNORMAL LOW (ref 0.61–1.24)
GFR calc Af Amer: >60 mL/min (ref >60)
GFR calc non Af Amer: >60 mL/min (ref >60)
Glucose, Bld: 129 mg/dL — ABNORMAL HIGH (ref 70–99)
POTASSIUM: 4.1 mmol/L (ref 3.5–5.1)
Sodium: 138 mmol/L (ref 135–145)

## 2018-05-07 LAB — MAGNESIUM: Magnesium: 2.3 mg/dL (ref 1.7–2.4)

## 2018-05-07 NOTE — Progress Notes (Signed)
Pulmonary Critical Care Medicine [facility] [place]   PULMONARY CRITICAL CARE SERVICE  PROGRESS NOTE  Date of Service: 05/07/2018  Joseph Hampton  XYI:[id number]  DOB: 06-19-1970   DOA: 04/30/2018  Referring Physician: Carron Curie, MD  HPI: Joseph Hampton is a 48 y.o. male seen for follow up of Acute on Chronic Respiratory Failure.  He is on T collar today he is doing well.  Yesterday he did about 4 hours today the goal is 8 hours on the T collar.  Currently is on 35% FiO2.  Chest x-ray shows increased density on the left side could be fluid versus atelectasis.  I would recommend doing an ultrasound to evaluate  Medications: Reviewed on Rounds  Physical Exam:  Vitals: Temperature 98.3 pulse 85 respiratory rate 17 blood pressure 114/69 saturations 99%  Ventilator Settings patient is currently on T collar FiO2 35%  . General: Comfortable at this time . Eyes: Grossly normal lids, irises & conjunctiva . ENT: grossly tongue is normal . Neck: no obvious mass . Cardiovascular: S1 S2 normal no gallop . Respiratory: No rhonchi or rales are noted at this time . Abdomen: soft . Skin: no rash seen on limited exam . Musculoskeletal: not rigid . Psychiatric:unable to assess . Neurologic: no seizure no involuntary movements         Lab Data:   Basic Metabolic Panel: Recent Labs  Lab 05/01/18 0545 05/05/18 0541 05/06/18 0604 05/07/18 0602  NA 138 137 137 138  K 3.8 3.1* 3.0* 4.1  CL 103 102 97* 97*  CO2 20* 24 26 30   GLUCOSE 123* 93 100* 129*  BUN 6 12 12  21*  CREATININE 0.41* 0.37* 0.42* 0.44*  CALCIUM 9.2 8.8* 9.3 9.6  MG 2.1 1.9  --  2.3  PHOS 3.2  --   --   --     ABG: Recent Labs  Lab 05/01/18 0505  PHART 7.440  PCO2ART 30.5*  PO2ART 104  HCO3 20.4  O2SAT 98.1    Liver Function Tests: Recent Labs  Lab 05/01/18 0545  AST 19  ALT 29  ALKPHOS 76  BILITOT 1.4*  PROT 6.6  ALBUMIN 3.0*   No results for input(s): LIPASE, AMYLASE in the last 168  hours. No results for input(s): AMMONIA in the last 168 hours.  CBC: Recent Labs  Lab 05/01/18 0545 05/05/18 0541 05/06/18 0604  WBC 9.5 5.8 6.4  NEUTROABS 7.0  --   --   HGB 13.0 12.3* 13.6  HCT 40.9 36.4* 41.0  MCV 94.0 91.0 92.8  PLT 354 223 249    Cardiac Enzymes: No results for input(s): CKTOTAL, CKMB, CKMBINDEX, TROPONINI in the last 168 hours.  BNP (last 3 results) No results for input(s): BNP in the last 8760 hours.  ProBNP (last 3 results) No results for input(s): PROBNP in the last 8760 hours.  Radiological Exams: Dg Chest Port 1 View  Result Date: 05/06/2018 CLINICAL DATA:  Collapse of left lung. EXAM: PORTABLE CHEST 1 VIEW COMPARISON:  One-view chest x-ray 05/01/2018 FINDINGS: Heart is enlarged. Tracheostomy tube is in place. Right IJ line is in place. There is improving aeration at the left base. Gastric or colonic distention previously noted has resolved. Left basilar airspace disease, likely atelectasis is improved. The right lung base is clear. IMPRESSION: 1. Improving aeration at the left base with decreased elevation of left hemidiaphragm. 2. Persistent but decreasing left base atelectasis and effusion. Electronically Signed   By: Marin Roberts M.D.   On:  05/06/2018 07:46   Dg Abd Portable 1v  Result Date: 05/06/2018 CLINICAL DATA:  Ileus EXAM: PORTABLE ABDOMEN - 1 VIEW COMPARISON:  05/04/2018 FINDINGS: Gastrostomy tube noted. There is improvement in the air distension of the small bowel. Colonic air distention remains, also slightly less. Sigmoid diverticulosis noted. Negative for obstruction. No abnormal calcifications. Degenerative changes spine. IMPRESSION: Improvement in gaseous distention of bowel compatible resolving ileus. Electronically Signed   By: Judie Petit.  Shick M.D.   On: 05/06/2018 07:43    Assessment/Plan Active Problems:   Acute on chronic respiratory failure with hypoxia (HCC)   Lobar pneumonia (HCC)   Atelectasis of left lung   Severe  sepsis (HCC)   Muscular dystrophy (HCC)   1. Acute on chronic respiratory failure with hypoxia we will continue with T collar goal 8 hours today. 2. Lobar pneumonia treated we will continue with present management. 3. Atelectasis continue with aggressive pulmonary toilet he was refusing the chest vest today. 4. Severe sepsis resolved 5. Muscular dystrophy at baseline advanced disease   I have personally seen and evaluated the patient, evaluated laboratory and imaging results, formulated the assessment and plan and placed orders. The Patient requires high complexity decision making for assessment and support.  Case was discussed on Rounds with the Respiratory Therapy Staff  Yevonne Pax, MD [facility] Pulmonary Critical Care Medicine Sleep Medicine

## 2018-05-08 DIAGNOSIS — J9621 Acute and chronic respiratory failure with hypoxia: Secondary | ICD-10-CM | POA: Diagnosis not present

## 2018-05-08 DIAGNOSIS — J181 Lobar pneumonia, unspecified organism: Secondary | ICD-10-CM | POA: Diagnosis not present

## 2018-05-08 DIAGNOSIS — J9811 Atelectasis: Secondary | ICD-10-CM | POA: Diagnosis not present

## 2018-05-08 DIAGNOSIS — G7100 Muscular dystrophy, unspecified: Secondary | ICD-10-CM | POA: Diagnosis not present

## 2018-05-08 NOTE — Progress Notes (Signed)
Pulmonary Critical Care Medicine [facility] [place]   PULMONARY CRITICAL CARE SERVICE  PROGRESS NOTE  Date of Service: 05/08/2018  Joseph Hampton  YDX:[id number]  DOB: Feb 13, 1971   DOA: 04/30/2018  Referring Physician: Carron Curie, MD  HPI: Joseph Hampton is a 48 y.o. male seen for follow up of Acute on Chronic Respiratory Failure.  Patient currently is on T collar has been on 28% FiO2 did about 1 hour and then was placed back on the ventilator.  Medications: Reviewed on Rounds  Physical Exam:  Vitals: Temperature 96.8 pulse 77 respiratory 18 blood pressure 116/63 saturations 97%  Ventilator Settings currently is back on the ventilator on assist control FiO2 28% tidal volume 380 PEEP 5 patient had been on T collar but got fatigued with the PMV  . General: Comfortable at this time . Eyes: Grossly normal lids, irises & conjunctiva . ENT: grossly tongue is normal . Neck: no obvious mass . Cardiovascular: S1 S2 normal no gallop . Respiratory: No rhonchi or rales are noted at this time . Abdomen: soft . Skin: no rash seen on limited exam . Musculoskeletal: not rigid . Psychiatric:unable to assess . Neurologic: no seizure no involuntary movements         Lab Data:   Basic Metabolic Panel: Recent Labs  Lab 05/05/18 0541 05/06/18 0604 05/07/18 0602  NA 137 137 138  K 3.1* 3.0* 4.1  CL 102 97* 97*  CO2 24 26 30   GLUCOSE 93 100* 129*  BUN 12 12 21*  CREATININE 0.37* 0.42* 0.44*  CALCIUM 8.8* 9.3 9.6  MG 1.9  --  2.3    ABG: No results for input(s): PHART, PCO2ART, PO2ART, HCO3, O2SAT in the last 168 hours.  Liver Function Tests: No results for input(s): AST, ALT, ALKPHOS, BILITOT, PROT, ALBUMIN in the last 168 hours. No results for input(s): LIPASE, AMYLASE in the last 168 hours. No results for input(s): AMMONIA in the last 168 hours.  CBC: Recent Labs  Lab 05/05/18 0541 05/06/18 0604  WBC 5.8 6.4  HGB 12.3* 13.6  HCT 36.4* 41.0  MCV 91.0 92.8   PLT 223 249    Cardiac Enzymes: No results for input(s): CKTOTAL, CKMB, CKMBINDEX, TROPONINI in the last 168 hours.  BNP (last 3 results) No results for input(s): BNP in the last 8760 hours.  ProBNP (last 3 results) No results for input(s): PROBNP in the last 8760 hours.  Radiological Exams: No results found.  Assessment/Plan Active Problems:   Acute on chronic respiratory failure with hypoxia (HCC)   Lobar pneumonia (HCC)   Atelectasis of left lung   Severe sepsis (HCC)   Muscular dystrophy (HCC)   1. Acute on chronic respiratory failure with hypoxia we will continue with full support right now reassess for weaning.  Patient did not tolerate the PMV became fatigued 2. Severe sepsis clinically improved 3. Muscular dystrophy at baseline 4. Pleural effusion ordered ultrasound-guided thoracentesis 5. Atelectasis continue aggressive pulmonary toilet supportive care 6. Lobar pneumonia treated   I have personally seen and evaluated the patient, evaluated laboratory and imaging results, formulated the assessment and plan and placed orders. The Patient requires high complexity decision making for assessment and support.  Case was discussed on Rounds with the Respiratory Therapy Staff  Yevonne Pax, MD [facility] Pulmonary Critical Care Medicine Sleep Medicine

## 2018-05-09 ENCOUNTER — Other Ambulatory Visit (HOSPITAL_COMMUNITY): Payer: Self-pay

## 2018-05-09 DIAGNOSIS — J181 Lobar pneumonia, unspecified organism: Secondary | ICD-10-CM | POA: Diagnosis not present

## 2018-05-09 DIAGNOSIS — J9621 Acute and chronic respiratory failure with hypoxia: Secondary | ICD-10-CM | POA: Diagnosis not present

## 2018-05-09 DIAGNOSIS — J9811 Atelectasis: Secondary | ICD-10-CM | POA: Diagnosis not present

## 2018-05-09 DIAGNOSIS — G7100 Muscular dystrophy, unspecified: Secondary | ICD-10-CM | POA: Diagnosis not present

## 2018-05-09 MED ORDER — LIDOCAINE HCL (PF) 1 % IJ SOLN
INTRAMUSCULAR | Status: AC
  Filled 2018-05-09: qty 30

## 2018-05-09 NOTE — Progress Notes (Addendum)
[facility] [place]   PULMONARY CRITICAL CARE SERVICE  PROGRESS NOTE  Date of Service: 05/09/2018  Joseph ShockDarrin L Mcelrath  NFA:[id number]  DOB: March 24, 1971   DOA: 04/30/2018  Referring Physician: Carron CurieAli Hijazi, MD  HPI: Joseph Hampton is a 48 y.o. male seen for follow up of Acute on Chronic Respiratory Failure.  Patient is doing well at this time on trach collar at 28% FiO2.  Once he reaches 12 hours he will go back on the vent for 12 hours.  Patient had thoracentesis that revealed no fluid mostly consolidation.  Patient will need bronchoscopy.  Medications: Reviewed on Rounds  Physical Exam:  Vitals: Pulse 83 respirations 23 blood pressure 129/78 O2 sat 97% temp 96.6  Ventilator Settings patient not currently on ventilator.  . General: Comfortable at this time . Eyes: Grossly normal lids, irises & conjunctiva . ENT: grossly tongue is normal . Neck: no obvious mass . Cardiovascular: S1 S2 normal no gallop . Respiratory: No wheezes or rhonchi noted . Abdomen: soft . Skin: no rash seen on limited exam . Musculoskeletal: not rigid . Psychiatric:unable to assess . Neurologic: no seizure no involuntary movements         Lab Data:   Basic Metabolic Panel: Recent Labs  Lab 05/05/18 0541 05/06/18 0604 05/07/18 0602  NA 137 137 138  K 3.1* 3.0* 4.1  CL 102 97* 97*  CO2 24 26 30   GLUCOSE 93 100* 129*  BUN 12 12 21*  CREATININE 0.37* 0.42* 0.44*  CALCIUM 8.8* 9.3 9.6  MG 1.9  --  2.3    ABG: No results for input(s): PHART, PCO2ART, PO2ART, HCO3, O2SAT in the last 168 hours.  Liver Function Tests: No results for input(s): AST, ALT, ALKPHOS, BILITOT, PROT, ALBUMIN in the last 168 hours. No results for input(s): LIPASE, AMYLASE in the last 168 hours. No results for input(s): AMMONIA in the last 168 hours.  CBC: Recent Labs  Lab 05/05/18 0541 05/06/18 0604  WBC 5.8 6.4  HGB 12.3* 13.6  HCT 36.4* 41.0  MCV 91.0 92.8  PLT 223 249     Cardiac Enzymes: No results for input(s): CKTOTAL, CKMB, CKMBINDEX, TROPONINI in the last 168 hours.  BNP (last 3 results) No results for input(s): BNP in the last 8760 hours.  ProBNP (last 3 results) No results for input(s): PROBNP in the last 8760 hours.  Radiological Exams: [place] Chest (pleural Effusion)  Result Date: 05/09/2018 CLINICAL DATA:  48 year old male with possible left pleural effusion EXAM: CHEST ULTRASOUND COMPARISON:  Chest x-ray 05/06/2018 FINDINGS: Ultrasound of the stick a shin of the left chest demonstrates consolidation of the left lower lobe. There may be a trace amount of pleural fluid but is insufficient for thoracentesis. Thoracentesis was deferred. IMPRESSION: Insufficient fluid for thoracentesis. Electronically Signed   By: Malachy MoanHeath  McCullough M.D.   On: 05/09/2018 12:39    Assessment/Plan Active Problems:   Acute on chronic respiratory failure with hypoxia (HCC)   Lobar pneumonia (HCC)   Atelectasis of left lung   Severe sepsis (HCC)   Muscular dystrophy (HCC)   1. Acute on chronic respiratory failure with hypoxia continue weaning per protocol.  Patient in process of doing 12 hours on trach collar today and will rest for 12 hours on vent tonight. 2. Severe sepsis clinically improved 3. Muscular dystrophy at baseline 4. Pleural effusion ultrasound obtained no fluid recognized on the consolidation.  Patient will be set up for bronchoscopy in the future. 5. Atelectasis  continue aggressive pulmonary toilet and supportive care 6. Lobar pneumonia treated   I have personally seen and evaluated the patient, evaluated laboratory and imaging results, formulated the assessment and plan and placed orders. The Patient requires high complexity decision making for assessment and support.  Case was discussed on Rounds with the Respiratory Therapy Staff  Yevonne PaxSaadat A Rhian Asebedo, MD [facility] Pulmonary Critical Care Medicine Sleep Medicine

## 2018-05-09 NOTE — Progress Notes (Signed)
  I was present during limited US of the left chest.  There is no pleural fluid. There is what appears to be consolidation of the left lung.  Thoracentesis not indicated.  Ashtan Girtman S Cyndel Griffey PA-C 05/09/2018 11:26 AM

## 2018-05-10 DIAGNOSIS — J181 Lobar pneumonia, unspecified organism: Secondary | ICD-10-CM | POA: Diagnosis not present

## 2018-05-10 DIAGNOSIS — J9621 Acute and chronic respiratory failure with hypoxia: Secondary | ICD-10-CM | POA: Diagnosis not present

## 2018-05-10 DIAGNOSIS — J9811 Atelectasis: Secondary | ICD-10-CM | POA: Diagnosis not present

## 2018-05-10 DIAGNOSIS — G7100 Muscular dystrophy, unspecified: Secondary | ICD-10-CM | POA: Diagnosis not present

## 2018-05-10 NOTE — Progress Notes (Signed)
[facility] [place]   PULMONARY CRITICAL CARE SERVICE  PROGRESS NOTE  Date of Service: 05/10/2018  Joseph Hampton  ZOX:[id number]  DOB: [birth year]   DOA: 04/30/2018  Referring Physician: Carron CurieAli Hijazi, MD  HPI: Joseph ShockDarrin L Hampton is a 48 y.o. male seen for follow up of Acute on Chronic Respiratory Failure.  Currently patient is on full vent support.  This morning he will be resumed on his wean.  The patient had an ultrasound done yesterday and it shows that there is no significant fluid on the left side likely is consolidation versus atelectasis.  Spoke with the patient as well as the wife and we will plan on scheduling him for a bronchoscopy  Medications: Reviewed on Rounds  Physical Exam:  Vitals: Temperature 97.0 pulse 72 respiratory 25 blood pressure 103/67 saturations 100%  Ventilator Settings mode ventilation assist control FiO2 28% tidal volume 386 PEEP 5  . General: Comfortable at this time . Eyes: Grossly normal lids, irises & conjunctiva . ENT: grossly tongue is normal . Neck: no obvious mass . Cardiovascular: S1 S2 normal no gallop . Respiratory: No rhonchi or rales are noted at this time . Abdomen: soft . Skin: no rash seen on limited exam . Musculoskeletal: not rigid . Psychiatric:unable to assess . Neurologic: no seizure no involuntary movements         Lab Data:   Basic Metabolic Panel: Recent Labs  Lab 05/05/18 0541 05/06/18 0604 05/07/18 0602  NA 137 137 138  K 3.1* 3.0* 4.1  CL 102 97* 97*  CO2 24 26 30   GLUCOSE 93 100* 129*  BUN 12 12 21*  CREATININE 0.37* 0.42* 0.44*  CALCIUM 8.8* 9.3 9.6  MG 1.9  --  2.3    ABG: No results for input(s): PHART, PCO2ART, PO2ART, HCO3, O2SAT in the last 168 hours.  Liver Function Tests: No results for input(s): AST, ALT, ALKPHOS, BILITOT, PROT, ALBUMIN in the last 168 hours. No results for input(s): LIPASE, AMYLASE in the last 168 hours. No results for input(s): AMMONIA  in the last 168 hours.  CBC: Recent Labs  Lab 05/05/18 0541 05/06/18 0604  WBC 5.8 6.4  HGB 12.3* 13.6  HCT 36.4* 41.0  MCV 91.0 92.8  PLT 223 249    Cardiac Enzymes: No results for input(s): CKTOTAL, CKMB, CKMBINDEX, TROPONINI in the last 168 hours.  BNP (last 3 results) No results for input(s): BNP in the last 8760 hours.  ProBNP (last 3 results) No results for input(s): PROBNP in the last 8760 hours.  Radiological Exams: [place] Chest (pleural Effusion)  Result Date: 05/09/2018 CLINICAL DATA:  48 year old male with possible left pleural effusion EXAM: CHEST ULTRASOUND COMPARISON:  Chest x-ray 05/06/2018 FINDINGS: Ultrasound of the stick a shin of the left chest demonstrates consolidation of the left lower lobe. There may be a trace amount of pleural fluid but is insufficient for thoracentesis. Thoracentesis was deferred. IMPRESSION: Insufficient fluid for thoracentesis. Electronically Signed   By: Malachy MoanHeath  McCullough M.D.   On: 05/09/2018 12:39    Assessment/Plan Active Problems:   Acute on chronic respiratory failure with hypoxia (HCC)   Lobar pneumonia (HCC)   Atelectasis of left lung   Severe sepsis (HCC)   Muscular dystrophy (HCC)   1. Acute on chronic respiratory failure with hypoxia we will continue with the wean as tolerated.  Titrate oxygen continue pulmonary toilet 2. Lobar pneumonia treated we will continue to monitor 3. Atelectasis of the left lung we will  schedule for bronchoscopy in the morning 4. Severe sepsis resolved 5. Muscular dystrophy progressive disease   I have personally seen and evaluated the patient, evaluated laboratory and imaging results, formulated the assessment and plan and placed orders. The Patient requires high complexity decision making for assessment and support.  Case was discussed on Rounds with the Respiratory Therapy Staff  Yevonne PaxSaadat A Chevette Fee, MD [facility] Pulmonary Critical Care Medicine Sleep Medicine

## 2018-05-11 DIAGNOSIS — J181 Lobar pneumonia, unspecified organism: Secondary | ICD-10-CM | POA: Diagnosis not present

## 2018-05-11 DIAGNOSIS — J9811 Atelectasis: Secondary | ICD-10-CM | POA: Diagnosis not present

## 2018-05-11 LAB — BASIC METABOLIC PANEL
Anion gap: 12 (ref 5–15)
BUN: 28 mg/dL — ABNORMAL HIGH (ref 6–20)
CO2: 27 mmol/L (ref 22–32)
Calcium: 9.5 mg/dL (ref 8.9–10.3)
Chloride: 100 mmol/L (ref 98–111)
Creatinine, Ser: 0.39 mg/dL — ABNORMAL LOW (ref 0.61–1.24)
GFR calc non Af Amer: >60 mL/min (ref >60)
Glucose, Bld: 108 mg/dL — ABNORMAL HIGH (ref 70–99)
Potassium: 4 mmol/L (ref 3.5–5.1)
Sodium: 139 mmol/L (ref 135–145)

## 2018-05-11 LAB — CBC
HEMATOCRIT: 39.6 % (ref 39.0–52.0)
Hemoglobin: 13 g/dL (ref 13.0–17.0)
MCH: 30.8 pg (ref 26.0–34.0)
MCHC: 32.8 g/dL (ref 30.0–36.0)
MCV: 93.8 fL (ref 80.0–100.0)
Platelets: 206 10*3/uL (ref 150–400)
RBC: 4.22 MIL/uL (ref 4.22–5.81)
RDW: 13.2 % (ref 11.5–15.5)
WBC: 6.7 10*3/uL (ref 4.0–10.5)
nRBC: 0 % (ref 0.0–0.2)

## 2018-05-11 LAB — MAGNESIUM: Magnesium: 2.3 mg/dL (ref 1.7–2.4)

## 2018-05-11 NOTE — Procedures (Signed)
Date: 05/11/2018,  MRN# [id number]    Procedure Note: Fiberoptic Bronchoscopy   PROCEDURE DATE: 05/11/2018     NAME:  Joseph Hampton   DOB:1970/09/10   MRN: [id number] LOC:  [id number]      Indications/Preliminary Diagnosis: Atelectasis left lower lobe  Consent: (Place X beside choice/s below)  The benefits, risks and possible complications of the procedure were        explained to:  _X__ patient  __X_ patient's family  ___ other:___________  who verbalized understanding and gave:  ___ verbal  _X__ written  ___ verbal and written  ___ telephone  ___ other:________ consent.      Unable to obtain consent; procedure performed on emergent basis.     Other:      PRESEDATION ASSESSMENT: History and Physical has been performed. Patient meds and allergies have been reviewed. Presedation airway examination has been performed and documented. Baseline vital signs, sedation score, oxygenation status, and cardiac rhythm were reviewed. Patient was deemed to be in satisfactory condition to undergo the procedure.  PREMEDICATIONS:   Sedative/Narcotic Amt Dose   Versed 4 mg   Fentanyl 100 mcg  Diprivan  mg         Insertion Route (Place X beside choice below)   Nasal   Oral   Endotracheal Tube  X Tracheostomy   INTRAPROCEDURE MEDICATIONS:  Sedative/Narcotic Amt Dose   Versed  mg   Fentanyl  mcg  Diprivan  mg       Medication Amt Dose  Medication Amt Dose  Xylocaine 2% 10 cc  Epinephrine 1:10,000 sol  cc  Xylocaine 4%  cc  Cocaine  cc   TECHNICAL PROCEDURES: (Place X beside choice below)   Procedures  Description  X  None     Electrocautery     Cryotherapy     Balloon Dilatation     Bronchography     Stent Placement     Therapeutic Aspiration     Laser/Argon Plasma            SPECIMENS (Sites): (Place X beside choice below)  Specimens Description   No Specimens Obtained     Washings   X Lavage    Biopsies    Fine Needle Aspirates    Brushings    Sputum     FINDINGS:  ESTIMATED BLOOD LOSS: none  COMPLICATIONS/RESOLUTION: none  PROCEDURE DETAILS: Timeout performed and correct patient, name, & ID confirmed. Following prep per Pulmonary policy, appropriate sedation was administered.  Airway exam proceeded with findings, technical procedures, and specimen collection as noted below. At the end of exam the scope was withdrawn without incident. Impression and Plan as noted below.    Procedure Note: After informed written consent was obtained patient was prepared in the usual manner.  4 mg of Versed and 100 mcg of fentanyl was given preprocedure.  Patient achieved adequate sedation.  The fiberoptic scope was inserted through the tracheostomy down to the carina.  The carina was nice and sharp.  On immediate inspection from the left side we noted that there was mucous plugs and excessive secretions.  Scope was inserted into the left mainstem bronchus the area was cleared by suctioning.  Next bronchoalveolar lavage was performed from the left lower lobe and lingula.  The return was clear and mucousy with thick secretions.  The left upper lobe lingula and lower lobe were all examined and found to be free of endobronchial disease.  Next the scope was inserted into the right mainstem  bronchus again then secretions were noted these were cleared.  Next bronchoalveolar lavage was performed from the right lower lobe.  Patient had good return and patient tolerated the procedure well    IMPRESSION:POST-PROCEDURE DX: Mucous plugging left mainstem   RECOMMENDATION/PLAN: Aggressive pulmonary toilet continue with Mucomyst and chest PT as recommended specimens were sent for cultures and cytology will await results.  I have personally performed the procedure as noted above     Allyne Gee, MD [facility] Pulmonary Critical Care Medicine

## 2018-05-12 DIAGNOSIS — J181 Lobar pneumonia, unspecified organism: Secondary | ICD-10-CM | POA: Diagnosis not present

## 2018-05-12 DIAGNOSIS — G7100 Muscular dystrophy, unspecified: Secondary | ICD-10-CM | POA: Diagnosis not present

## 2018-05-12 DIAGNOSIS — J9811 Atelectasis: Secondary | ICD-10-CM | POA: Diagnosis not present

## 2018-05-12 DIAGNOSIS — J9621 Acute and chronic respiratory failure with hypoxia: Secondary | ICD-10-CM | POA: Diagnosis not present

## 2018-05-12 NOTE — Progress Notes (Addendum)
Pulmonary Critical Care Medicine [facility] [place]   PULMONARY CRITICAL CARE SERVICE  PROGRESS NOTE  Date of Service: 05/12/2018  [name]  ION:[id number]  DOB: 31-Oct-1970   DOA: 04/30/2018  Referring Physician: Carron Curie, MD  HPI: Joseph Hampton is a 48 y.o. male seen for follow up of Acute on Chronic Respiratory Failure.  Patient doing well since bronchoscopy yesterday.  He is currently on 28% FiO2 via trach collar.  His goal is 12 hours today and is currently doing well.  Medications: Reviewed on Rounds  Physical Exam:  Vitals: Pulse 66 respiration 22 blood pressure 126/75 O2 sat 100% temp 96.9  Ventilator Settings patient's not currently on ventilator  . General: Comfortable at this time . Eyes: Grossly normal lids, irises & conjunctiva . ENT: grossly tongue is normal . Neck: no obvious mass . Cardiovascular: S1 S2 normal no gallop . Respiratory: No wheezes or rhonchi noted . Abdomen: soft . Skin: no rash seen on limited exam . Musculoskeletal: not rigid . Psychiatric:unable to assess . Neurologic: no seizure no involuntary movements         Lab Data:   Basic Metabolic Panel: Recent Labs  Lab 05/06/18 0604 05/07/18 0602 05/11/18 0557  NA 137 138 139  K 3.0* 4.1 4.0  CL 97* 97* 100  CO2 26 30 27   GLUCOSE 100* 129* 108*  BUN 12 21* 28*  CREATININE 0.42* 0.44* 0.39*  CALCIUM 9.3 9.6 9.5  MG  --  2.3 2.3    ABG: No results for input(s): PHART, PCO2ART, PO2ART, HCO3, O2SAT in the last 168 hours.  Liver Function Tests: No results for input(s): AST, ALT, ALKPHOS, BILITOT, PROT, ALBUMIN in the last 168 hours. No results for input(s): LIPASE, AMYLASE in the last 168 hours. No results for input(s): AMMONIA in the last 168 hours.  CBC: Recent Labs  Lab 05/06/18 0604 05/11/18 0557  WBC 6.4 6.7  HGB 13.6 13.0  HCT 41.0 39.6  MCV 92.8 93.8  PLT 249 206    Cardiac Enzymes: No results for input(s): CKTOTAL, CKMB, CKMBINDEX, TROPONINI  in the last 168 hours.  BNP (last 3 results) No results for input(s): BNP in the last 8760 hours.  ProBNP (last 3 results) No results for input(s): PROBNP in the last 8760 hours.  Radiological Exams: No results found.  Assessment/Plan Active Problems:   Acute on chronic respiratory failure with hypoxia (HCC)   Lobar pneumonia (HCC)   Atelectasis of left lung   Severe sepsis (HCC)   Muscular dystrophy (HCC)   1. Acute on chronic respiratory failure with hypoxia we will continue with the wean as tolerated again goal is 12 hours today on trach collar 28%.  Continue pulmonary toilet 2. Lobar pneumonia treated continue to monitor 3. Atelectasis of left lung bronchoscopy performed yesterday 4. Severe sepsis resolved 5. Muscular dystrophy progressive disease   I have personally seen and evaluated the patient, evaluated laboratory and imaging results, formulated the assessment and plan and placed orders. The Patient requires high complexity decision making for assessment and support.  Case was discussed on Rounds with the Respiratory Therapy Staff  Yevonne Pax, MD [facility] Pulmonary Critical Care Medicine Sleep Medicine

## 2018-05-13 DIAGNOSIS — J9621 Acute and chronic respiratory failure with hypoxia: Secondary | ICD-10-CM | POA: Diagnosis not present

## 2018-05-13 DIAGNOSIS — G7100 Muscular dystrophy, unspecified: Secondary | ICD-10-CM | POA: Diagnosis not present

## 2018-05-13 DIAGNOSIS — J9811 Atelectasis: Secondary | ICD-10-CM | POA: Diagnosis not present

## 2018-05-13 DIAGNOSIS — J181 Lobar pneumonia, unspecified organism: Secondary | ICD-10-CM | POA: Diagnosis not present

## 2018-05-13 LAB — BASIC METABOLIC PANEL
Anion gap: 9 (ref 5–15)
BUN: 23 mg/dL — ABNORMAL HIGH (ref 6–20)
CO2: 31 mmol/L (ref 22–32)
Calcium: 9.7 mg/dL (ref 8.9–10.3)
Chloride: 98 mmol/L (ref 98–111)
Creatinine, Ser: 0.39 mg/dL — ABNORMAL LOW (ref 0.61–1.24)
GFR calc Af Amer: >60 mL/min (ref >60)
Glucose, Bld: 103 mg/dL — ABNORMAL HIGH (ref 70–99)
Potassium: 4.1 mmol/L (ref 3.5–5.1)
Sodium: 138 mmol/L (ref 135–145)

## 2018-05-13 LAB — CULTURE, RESPIRATORY W GRAM STAIN

## 2018-05-13 NOTE — Progress Notes (Signed)
Pulmonary Critical Care Medicine [facility] [place]   PULMONARY CRITICAL CARE SERVICE  PROGRESS NOTE  Date of Service: 05/13/2018  Joseph Hampton  GEX:[id number]  DOB: November 16, 1970   DOA: 04/30/2018  Referring Physician: Carron Curie, MD  HPI: Joseph Hampton is a 48 y.o. male seen for follow up of Acute on Chronic Respiratory Failure.  At this time patient is on T collar at the goal is to try for 12 to 16 hours.  Patient was on 28% FiO2  Medications: Reviewed on Rounds  Physical Exam:  Vitals: Pressure 97.3 pulse 83 respiratory 24 blood pressure 103/67 saturations 97%  Ventilator Settings currently on T collar FiO2 28%  . General: Comfortable at this time . Eyes: Grossly normal lids, irises & conjunctiva . ENT: grossly tongue is normal . Neck: no obvious mass . Cardiovascular: S1 S2 normal no gallop . Respiratory: No rhonchi or rales are noted at this time . Abdomen: soft . Skin: no rash seen on limited exam . Musculoskeletal: not rigid . Psychiatric:unable to assess . Neurologic: no seizure no involuntary movements         Lab Data:   Basic Metabolic Panel: Recent Labs  Lab 05/07/18 0602 05/11/18 0557 05/13/18 1240  NA 138 139 138  K 4.1 4.0 4.1  CL 97* 100 98  CO2 30 27 31   GLUCOSE 129* 108* 103*  BUN 21* 28* 23*  CREATININE 0.44* 0.39* 0.39*  CALCIUM 9.6 9.5 9.7  MG 2.3 2.3  --     ABG: No results for input(s): PHART, PCO2ART, PO2ART, HCO3, O2SAT in the last 168 hours.  Liver Function Tests: No results for input(s): AST, ALT, ALKPHOS, BILITOT, PROT, ALBUMIN in the last 168 hours. No results for input(s): LIPASE, AMYLASE in the last 168 hours. No results for input(s): AMMONIA in the last 168 hours.  CBC: Recent Labs  Lab 05/11/18 0557  WBC 6.7  HGB 13.0  HCT 39.6  MCV 93.8  PLT 206    Cardiac Enzymes: No results for input(s): CKTOTAL, CKMB, CKMBINDEX, TROPONINI in the last 168 hours.  BNP (last 3 results) No results for input(s):  BNP in the last 8760 hours.  ProBNP (last 3 results) No results for input(s): PROBNP in the last 8760 hours.  Radiological Exams: No results found.  Assessment/Plan Active Problems:   Acute on chronic respiratory failure with hypoxia (HCC)   Lobar pneumonia (HCC)   Atelectasis of left lung   Severe sepsis (HCC)   Muscular dystrophy (HCC)   1. Acute on chronic respiratory failure with hypoxia we will continue with T collar will up to 16 hours 2. 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 Lobar pneumonia treated 4. Atelectasis continue aggressive pulmonary toilet patient has been refusing a chest vest however 5. Severe sepsis resolved   I have personally seen and evaluated the patient, evaluated laboratory and imaging results, formulated the assessment and plan and placed orders. The Patient requires high complexity decision making for assessment and support.  Case was discussed on Rounds with the Respiratory Therapy Staff  Yevonne Pax, MD [facility] Pulmonary Critical Care Medicine Sleep Medicine

## 2018-05-14 DIAGNOSIS — G7100 Muscular dystrophy, unspecified: Secondary | ICD-10-CM | POA: Diagnosis not present

## 2018-05-14 DIAGNOSIS — J181 Lobar pneumonia, unspecified organism: Secondary | ICD-10-CM | POA: Diagnosis not present

## 2018-05-14 DIAGNOSIS — J9811 Atelectasis: Secondary | ICD-10-CM | POA: Diagnosis not present

## 2018-05-14 DIAGNOSIS — J9621 Acute and chronic respiratory failure with hypoxia: Secondary | ICD-10-CM | POA: Diagnosis not present

## 2018-05-14 LAB — ACID FAST SMEAR (AFB, MYCOBACTERIA): Acid Fast Smear: NEGATIVE

## 2018-05-14 NOTE — Progress Notes (Addendum)
Pulmonary Critical Care Medicine [facility] [place]   PULMONARY CRITICAL CARE SERVICE  PROGRESS NOTE  Date of Service: 05/14/2018  Joseph Hampton  TIW:[id number]  DOB: 13-Sep-1970   DOA: 04/30/2018  Referring Physician: Carron Curie, MD  HPI: Joseph Hampton is a 48 y.o. male seen for follow up of Acute on Chronic Respiratory Failure.  Patient is doing well today he has been downsized to a #6 Shiley trach.  After downsizing today he had difficulty with some panic and anxiety.  He was placed back on full support on the vent to rest for the rest of the day.  They will resume weaning protocol as of tomorrow.  Medications: Reviewed on Rounds  Physical Exam:  Vitals: Pulse 71 respirations 24 blood pressure 116/63 O2 sat 98% temp 97.6  Ventilator Settings ventilator mode AC VC rate of 14 tidal volume 380 FiO2 28% PEEP of 5  . General: Comfortable at this time . Eyes: Grossly normal lids, irises & conjunctiva . ENT: grossly tongue is normal . Neck: no obvious mass . Cardiovascular: S1 S2 normal no gallop . Respiratory: No rhonchi rales noted . Abdomen: soft . Skin: no rash seen on limited exam . Musculoskeletal: not rigid . Psychiatric:unable to assess . Neurologic: no seizure no involuntary movements         Lab Data:   Basic Metabolic Panel: Recent Labs  Lab 05/11/18 0557 05/13/18 1240  NA 139 138  K 4.0 4.1  CL 100 98  CO2 27 31  GLUCOSE 108* 103*  BUN 28* 23*  CREATININE 0.39* 0.39*  CALCIUM 9.5 9.7  MG 2.3  --     ABG: No results for input(s): PHART, PCO2ART, PO2ART, HCO3, O2SAT in the last 168 hours.  Liver Function Tests: No results for input(s): AST, ALT, ALKPHOS, BILITOT, PROT, ALBUMIN in the last 168 hours. No results for input(s): LIPASE, AMYLASE in the last 168 hours. No results for input(s): AMMONIA in the last 168 hours.  CBC: Recent Labs  Lab 05/11/18 0557  WBC 6.7  HGB 13.0  HCT 39.6  MCV 93.8  PLT 206    Cardiac Enzymes: No  results for input(s): CKTOTAL, CKMB, CKMBINDEX, TROPONINI in the last 168 hours.  BNP (last 3 results) No results for input(s): BNP in the last 8760 hours.  ProBNP (last 3 results) No results for input(s): PROBNP in the last 8760 hours.  Radiological Exams: No results found.  Assessment/Plan Active Problems:   Acute on chronic respiratory failure with hypoxia (HCC)   Lobar pneumonia (HCC)   Atelectasis of left lung   Severe sepsis (HCC)   Muscular dystrophy (HCC)   1. Acute on chronic respiratory failure with hypoxia.  Tracheostomy downsized to #6 Shiley.  Patient remains on full support since downsizing due to anxiety.  We will continue weaning in the morning. 2. Muscular dystrophy advanced disease continue nocturnal support 3. Lobar pneumonia treated 4. Atelectasis continue aggressive pulmonary toilet patient is been refusing chest vest. 5. Severe sepsis resolved   I have personally seen and evaluated the patient, evaluated laboratory and imaging results, formulated the assessment and plan and placed orders. The Patient requires high complexity decision making for assessment and support.  Case was discussed on Rounds with the Respiratory Therapy Staff  Yevonne Pax, MD [facility] Pulmonary Critical Care Medicine Sleep Medicine

## 2018-05-15 DIAGNOSIS — J181 Lobar pneumonia, unspecified organism: Secondary | ICD-10-CM | POA: Diagnosis not present

## 2018-05-15 DIAGNOSIS — J9621 Acute and chronic respiratory failure with hypoxia: Secondary | ICD-10-CM | POA: Diagnosis not present

## 2018-05-15 DIAGNOSIS — G7100 Muscular dystrophy, unspecified: Secondary | ICD-10-CM | POA: Diagnosis not present

## 2018-05-15 DIAGNOSIS — J9811 Atelectasis: Secondary | ICD-10-CM | POA: Diagnosis not present

## 2018-05-15 NOTE — Progress Notes (Addendum)
[facility] [place]   PULMONARY CRITICAL CARE SERVICE  PROGRESS NOTE  Date of Service: 05/15/2018  Joseph Hampton  ZOX:[id number]  DOB: [birth year]   DOA: 04/30/2018  Referring Physician: Carron CurieAli Hijazi, MD  HPI: Joseph ShockDarrin L Maruyama is a 48 y.o. male seen for follow up of Acute on Chronic Respiratory Failure.  Patient remains doing well on trach collar at 28% FiO2.  He is resting on the vent at night.  Goal for trach collar today is 12 hours and patient is currently doing well.  Medications: Reviewed on Rounds  Physical Exam:  Vitals: Pulse 79 respirations 24 blood pressure 117/75 O2 sat 98% temp 97.3  Ventilator Settings patient's not currently on ventilator  . General: Comfortable at this time . Eyes: Grossly normal lids, irises & conjunctiva . ENT: grossly tongue is normal . Neck: no obvious mass . Cardiovascular: S1 S2 normal no gallop . Respiratory: No rales or rhonchi noted . Abdomen: soft . Skin: no rash seen on limited exam . Musculoskeletal: not rigid . Psychiatric:unable to assess . Neurologic: no seizure no involuntary movements         Lab Data:   Basic Metabolic Panel: Recent Labs  Lab 05/11/18 0557 05/13/18 1240  NA 139 138  K 4.0 4.1  CL 100 98  CO2 27 31  GLUCOSE 108* 103*  BUN 28* 23*  CREATININE 0.39* 0.39*  CALCIUM 9.5 9.7  MG 2.3  --     ABG: No results for input(s): PHART, PCO2ART, PO2ART, HCO3, O2SAT in the last 168 hours.  Liver Function Tests: No results for input(s): AST, ALT, ALKPHOS, BILITOT, PROT, ALBUMIN in the last 168 hours. No results for input(s): LIPASE, AMYLASE in the last 168 hours. No results for input(s): AMMONIA in the last 168 hours.  CBC: Recent Labs  Lab 05/11/18 0557  WBC 6.7  HGB 13.0  HCT 39.6  MCV 93.8  PLT 206    Cardiac Enzymes: No results for input(s): CKTOTAL, CKMB, CKMBINDEX, TROPONINI in the last 168 hours.  BNP (last 3 results) No results for input(s): BNP  in the last 8760 hours.  ProBNP (last 3 results) No results for input(s): PROBNP in the last 8760 hours.  Radiological Exams: No results found.  Assessment/Plan Active Problems:   Acute on chronic respiratory failure with hypoxia (HCC)   Lobar pneumonia (HCC)   Atelectasis of left lung   Severe sepsis (HCC)   Muscular dystrophy (HCC)   1. Acute on chronic respiratory failure with hypoxia.  Patient is doing well and family training has started so the patient may hopefully be DC'd tomorrow. 2. Osteodystrophy advanced disease continue nocturnal support 3. Lobar pneumonia treated 4. Atelectasis continue aggressive pulmonary toilet 5. Severe sepsis resolved   I have personally seen and evaluated the patient, evaluated laboratory and imaging results, formulated the assessment and plan and placed orders. The Patient requires high complexity decision making for assessment and support.  Case was discussed on Rounds with the Respiratory Therapy Staff  Yevonne PaxSaadat A Fermon Ureta, MD [facility] Pulmonary Critical Care Medicine Sleep Medicine

## 2018-05-16 ENCOUNTER — Other Ambulatory Visit (HOSPITAL_COMMUNITY): Payer: Self-pay

## 2018-05-16 DIAGNOSIS — J181 Lobar pneumonia, unspecified organism: Secondary | ICD-10-CM | POA: Diagnosis not present

## 2018-05-16 DIAGNOSIS — J9621 Acute and chronic respiratory failure with hypoxia: Secondary | ICD-10-CM | POA: Diagnosis not present

## 2018-05-16 DIAGNOSIS — J9811 Atelectasis: Secondary | ICD-10-CM | POA: Diagnosis not present

## 2018-05-16 DIAGNOSIS — G7100 Muscular dystrophy, unspecified: Secondary | ICD-10-CM | POA: Diagnosis not present

## 2018-05-16 LAB — BASIC METABOLIC PANEL
Anion gap: 11 (ref 5–15)
BUN: 22 mg/dL — ABNORMAL HIGH (ref 6–20)
CO2: 30 mmol/L (ref 22–32)
Calcium: 9.2 mg/dL (ref 8.9–10.3)
Chloride: 96 mmol/L — ABNORMAL LOW (ref 98–111)
Creatinine, Ser: 0.43 mg/dL — ABNORMAL LOW (ref 0.61–1.24)
GFR calc Af Amer: >60 mL/min (ref >60)
GFR calc non Af Amer: >60 mL/min (ref >60)
Glucose, Bld: 94 mg/dL (ref 70–99)
Potassium: 4.1 mmol/L (ref 3.5–5.1)
Sodium: 137 mmol/L (ref 135–145)

## 2018-05-16 LAB — CBC
HEMATOCRIT: 38.3 % — AB (ref 39.0–52.0)
Hemoglobin: 12.5 g/dL — ABNORMAL LOW (ref 13.0–17.0)
MCH: 30.4 pg (ref 26.0–34.0)
MCHC: 32.6 g/dL (ref 30.0–36.0)
MCV: 93.2 fL (ref 80.0–100.0)
Platelets: 201 10*3/uL (ref 150–400)
RBC: 4.11 MIL/uL — ABNORMAL LOW (ref 4.22–5.81)
RDW: 13.3 % (ref 11.5–15.5)
WBC: 5.9 10*3/uL (ref 4.0–10.5)
nRBC: 0 % (ref 0.0–0.2)

## 2018-05-16 LAB — MAGNESIUM: Magnesium: 2.2 mg/dL (ref 1.7–2.4)

## 2018-05-16 NOTE — Progress Notes (Signed)
Pulmonary Critical Care Medicine [facility] [place]   PULMONARY CRITICAL CARE SERVICE  PROGRESS NOTE  Date of Service: 05/16/2018  Joseph Hampton  [id number]  DOB: Dec 05, 1970   DOA: 04/30/2018  Referring Physician: Carron Curie, MD  HPI: Joseph Hampton is a 48 y.o. male seen for follow up of Acute on Chronic Respiratory Failure.  Patient is doing well has been tolerating T collar fairly well yesterday did his 12 hours.  Patient's family is also being trained for home care of the tracheostomy and the ventilator.  Medications: Reviewed on Rounds  Physical Exam:  Vitals: Temperature 97.6 pulse 98 respiratory rate 27 blood pressure 136/73 saturations 99%  Ventilator Settings currently is off the ventilator on T collar FiO2 28%  . General: Comfortable at this time . Eyes: Grossly normal lids, irises & conjunctiva . ENT: grossly tongue is normal . Neck: no obvious mass . Cardiovascular: S1 S2 normal no gallop . Respiratory: Scattered distant rhonchi noted . Abdomen: soft . Skin: no rash seen on limited exam . Musculoskeletal: not rigid . Psychiatric:unable to assess . Neurologic: no seizure no involuntary movements         Lab Data:   Basic Metabolic Panel: Recent Labs  Lab 05/11/18 0557 05/13/18 1240 05/16/18 0528  NA 139 138 137  K 4.0 4.1 4.1  CL 100 98 96*  CO2 27 31 30   GLUCOSE 108* 103* 94  BUN 28* 23* 22*  CREATININE 0.39* 0.39* 0.43*  CALCIUM 9.5 9.7 9.2  MG 2.3  --  2.2    ABG: No results for input(s): PHART, PCO2ART, PO2ART, HCO3, O2SAT in the last 168 hours.  Liver Function Tests: No results for input(s): AST, ALT, ALKPHOS, BILITOT, PROT, ALBUMIN in the last 168 hours. No results for input(s): LIPASE, AMYLASE in the last 168 hours. No results for input(s): AMMONIA in the last 168 hours.  CBC: Recent Labs  Lab 05/11/18 0557 05/16/18 0528  WBC 6.7 5.9  HGB 13.0 12.5*  HCT 39.6 38.3*  MCV 93.8 93.2  PLT 206 201    Cardiac  Enzymes: No results for input(s): CKTOTAL, CKMB, CKMBINDEX, TROPONINI in the last 168 hours.  BNP (last 3 results) No results for input(s): BNP in the last 8760 hours.  ProBNP (last 3 results) No results for input(s): PROBNP in the last 8760 hours.  Radiological Exams: Dg Chest Port 1 View  Result Date: 05/16/2018 CLINICAL DATA:  Follow-up tracheostomy tube EXAM: PORTABLE CHEST 1 VIEW COMPARISON:  05/06/2018 FINDINGS: Tracheostomy tube is noted in satisfactory position. The cardiac shadow is stable. Right jugular central line is been removed. Left basilar infiltrate/atelectasis is noted. Some new right basilar atelectasis is seen. No bony abnormality is noted. IMPRESSION: Bibasilar opacities new on the right. Electronically Signed   By: Alcide Clever M.D.   On: 05/16/2018 09:42    Assessment/Plan Active Problems:   Acute on chronic respiratory failure with hypoxia (HCC)   Lobar pneumonia (HCC)   Atelectasis of left lung   Severe sepsis (HCC)   Muscular dystrophy (HCC)   1. Acute on chronic respiratory failure with hypoxia we will continue with the T collar as ordered continue pulmonary toilet supportive care. 2. Lobar pneumonia treated improving 3. Atelectasis needs ongoing pulmonary toilet and chest PT 4. Severe sepsis resolved 5. 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 The  Patient requires high complexity decision making for assessment and support.  Case was discussed on Rounds with the Respiratory Therapy Staff  Allyne Gee, MD [facility] Pulmonary Critical Care Medicine Sleep Medicine

## 2018-05-17 DIAGNOSIS — J181 Lobar pneumonia, unspecified organism: Secondary | ICD-10-CM | POA: Diagnosis not present

## 2018-05-17 DIAGNOSIS — J9621 Acute and chronic respiratory failure with hypoxia: Secondary | ICD-10-CM | POA: Diagnosis not present

## 2018-05-17 DIAGNOSIS — J9811 Atelectasis: Secondary | ICD-10-CM | POA: Diagnosis not present

## 2018-05-17 DIAGNOSIS — G7100 Muscular dystrophy, unspecified: Secondary | ICD-10-CM | POA: Diagnosis not present

## 2018-05-17 NOTE — Progress Notes (Signed)
Pulmonary Critical Care Medicine [facility] [place]   PULMONARY CRITICAL CARE SERVICE  PROGRESS NOTE  Date of Service: 05/17/2018  Joseph Hampton  [id number]  DOB: Oct 31, 1970   DOA: 04/30/2018  Referring Physician: Carron Curie, MD  HPI: Joseph Hampton is a 48 y.o. male seen for follow up of Acute on Chronic Respiratory Failure.  Patient is on T collar rate now has been having increased fatigue.  Patient is on 28% FiO2.  He has been having issues with the cuff deflation is not able to tolerate it begins to cough and then gets fatigued.  We discussed the options available at this time currently he would like to go back on the ventilator.  Long-term think his disease is progressing and this may be a sign of the disease progression with early fatigue noted  Medications: Reviewed on Rounds  Physical Exam:  Vitals: Temperature 97.2 pulse 73 respiratory 18 blood pressure 126/73 saturations 100%  Ventilator Settings off the ventilator on T collar but will be placed back on his resting mode  . General: Comfortable at this time . Eyes: Grossly normal lids, irises & conjunctiva . ENT: grossly tongue is normal . Neck: no obvious mass . Cardiovascular: S1 S2 normal no gallop . Respiratory: No rhonchi or rales are noted at this time . Abdomen: soft . Skin: no rash seen on limited exam . Musculoskeletal: not rigid . Psychiatric:unable to assess . Neurologic: no seizure no involuntary movements         Lab Data:   Basic Metabolic Panel: Recent Labs  Lab 05/11/18 0557 05/13/18 1240 05/16/18 0528  NA 139 138 137  K 4.0 4.1 4.1  CL 100 98 96*  CO2 27 31 30   GLUCOSE 108* 103* 94  BUN 28* 23* 22*  CREATININE 0.39* 0.39* 0.43*  CALCIUM 9.5 9.7 9.2  MG 2.3  --  2.2    ABG: No results for input(s): PHART, PCO2ART, PO2ART, HCO3, O2SAT in the last 168 hours.  Liver Function Tests: No results for input(s): AST, ALT, ALKPHOS, BILITOT, PROT, ALBUMIN in the last 168  hours. No results for input(s): LIPASE, AMYLASE in the last 168 hours. No results for input(s): AMMONIA in the last 168 hours.  CBC: Recent Labs  Lab 05/11/18 0557 05/16/18 0528  WBC 6.7 5.9  HGB 13.0 12.5*  HCT 39.6 38.3*  MCV 93.8 93.2  PLT 206 201    Cardiac Enzymes: No results for input(s): CKTOTAL, CKMB, CKMBINDEX, TROPONINI in the last 168 hours.  BNP (last 3 results) No results for input(s): BNP in the last 8760 hours.  ProBNP (last 3 results) No results for input(s): PROBNP in the last 8760 hours.  Radiological Exams: Dg Chest Port 1 View  Result Date: 05/16/2018 CLINICAL DATA:  Follow-up tracheostomy tube EXAM: PORTABLE CHEST 1 VIEW COMPARISON:  05/06/2018 FINDINGS: Tracheostomy tube is noted in satisfactory position. The cardiac shadow is stable. Right jugular central line is been removed. Left basilar infiltrate/atelectasis is noted. Some new right basilar atelectasis is seen. No bony abnormality is noted. IMPRESSION: Bibasilar opacities new on the right. Electronically Signed   By: Alcide Clever M.D.   On: 05/16/2018 09:42    Assessment/Plan Active Problems:   Acute on chronic respiratory failure with hypoxia (HCC)   Lobar pneumonia (HCC)   Atelectasis of left lung   Severe sepsis (HCC)   Muscular dystrophy (HCC)   1. Acute on chronic respiratory failure with hypoxia we will continue with supportive care he had  been on T collar this morning but as above will be placed back on the ventilator because of early fatigue. 2. Lobar pneumonia treated we will continue with supportive care 3. Atelectasis of the lung continue aggressive pulmonary toilet supportive care 4. Sepsis resolved 5. Muscular dystrophy progressive disease showing increased signs of weakness.   I have personally seen and evaluated the patient, evaluated laboratory and imaging results, formulated the assessment and plan and placed orders.  Time 35 minutes extended conversation with the family on  rounds as well as discussion with the therapist The Patient requires high complexity decision making for assessment and support.  Case was discussed on Rounds with the Respiratory Therapy Staff  Yevonne Pax, MD [facility] Pulmonary Critical Care Medicine Sleep Medicine

## 2018-05-18 DIAGNOSIS — J181 Lobar pneumonia, unspecified organism: Secondary | ICD-10-CM | POA: Diagnosis not present

## 2018-05-18 DIAGNOSIS — J9811 Atelectasis: Secondary | ICD-10-CM | POA: Diagnosis not present

## 2018-05-18 DIAGNOSIS — J9621 Acute and chronic respiratory failure with hypoxia: Secondary | ICD-10-CM | POA: Diagnosis not present

## 2018-05-18 DIAGNOSIS — G7100 Muscular dystrophy, unspecified: Secondary | ICD-10-CM | POA: Diagnosis not present

## 2018-05-18 LAB — BASIC METABOLIC PANEL
Anion gap: 11 (ref 5–15)
BUN: 29 mg/dL — ABNORMAL HIGH (ref 6–20)
CO2: 30 mmol/L (ref 22–32)
CREATININE: 0.35 mg/dL — AB (ref 0.61–1.24)
Calcium: 9.8 mg/dL (ref 8.9–10.3)
Chloride: 95 mmol/L — ABNORMAL LOW (ref 98–111)
GFR calc Af Amer: >60 mL/min (ref >60)
GFR calc non Af Amer: >60 mL/min (ref >60)
Glucose, Bld: 121 mg/dL — ABNORMAL HIGH (ref 70–99)
Potassium: 4 mmol/L (ref 3.5–5.1)
Sodium: 136 mmol/L (ref 135–145)

## 2018-05-18 LAB — PHOSPHORUS: Phosphorus: 4.2 mg/dL (ref 2.5–4.6)

## 2018-05-18 LAB — CBC
HCT: 38.1 % — ABNORMAL LOW (ref 39.0–52.0)
Hemoglobin: 12.4 g/dL — ABNORMAL LOW (ref 13.0–17.0)
MCH: 29.7 pg (ref 26.0–34.0)
MCHC: 32.5 g/dL (ref 30.0–36.0)
MCV: 91.4 fL (ref 80.0–100.0)
Platelets: 202 10*3/uL (ref 150–400)
RBC: 4.17 MIL/uL — ABNORMAL LOW (ref 4.22–5.81)
RDW: 13.2 % (ref 11.5–15.5)
WBC: 6.2 10*3/uL (ref 4.0–10.5)
nRBC: 0 % (ref 0.0–0.2)

## 2018-05-18 LAB — MAGNESIUM: Magnesium: 2.3 mg/dL (ref 1.7–2.4)

## 2018-05-18 NOTE — Progress Notes (Addendum)
[facility] [place]   PULMONARY CRITICAL CARE SERVICE  PROGRESS NOTE  Date of Service: 05/18/2018  Joseph Hampton  [id number]  DOB: 1970/05/08   DOA: 04/30/2018  Referring Physician: Carron CurieAli Hijazi, MD  HPI: Joseph ShockDarrin L Vickrey is a 48 y.o. male seen for follow up of Acute on Chronic Respiratory Failure.  Patient does well on trach collar at this time 28% FiO2.  Patient once again today failed tracheal cuff deflation.  He became tachycardic and tachypneic within a few minutes of deflating the cuff.  Patient will receive 0.25 mg of Xanax and try to deflate cuff again.  Discussed CODE STATUS with family and they have agreed to make him a limited code no CPR at this time.  Medications: Reviewed on Rounds  Physical Exam:  Vitals: Pulse 65 respirations 18 BP 115/55 O2 sat 97% temp 98.1  Ventilator Settings patient's not currently on ventilator  . General: Comfortable at this time . Eyes: Grossly normal lids, irises & conjunctiva . ENT: grossly tongue is normal . Neck: no obvious mass . Cardiovascular: S1 S2 normal no gallop . Respiratory: No rales or rhonchi noted . Abdomen: soft . Skin: no rash seen on limited exam . Musculoskeletal: not rigid . Psychiatric:unable to assess . Neurologic: no seizure no involuntary movements         Lab Data:   Basic Metabolic Panel: Recent Labs  Lab 05/13/18 1240 05/16/18 0528 05/18/18 0720  NA 138 137 136  K 4.1 4.1 4.0  CL 98 96* 95*  CO2 31 30 30   GLUCOSE 103* 94 121*  BUN 23* 22* 29*  CREATININE 0.39* 0.43* 0.35*  CALCIUM 9.7 9.2 9.8  MG  --  2.2 2.3  PHOS  --   --  4.2    ABG: No results for input(s): PHART, PCO2ART, PO2ART, HCO3, O2SAT in the last 168 hours.  Liver Function Tests: No results for input(s): AST, ALT, ALKPHOS, BILITOT, PROT, ALBUMIN in the last 168 hours. No results for input(s): LIPASE, AMYLASE in the last 168 hours. No results for input(s): AMMONIA in the last 168  hours.  CBC: Recent Labs  Lab 05/16/18 0528 05/18/18 0720  WBC 5.9 6.2  HGB 12.5* 12.4*  HCT 38.3* 38.1*  MCV 93.2 91.4  PLT 201 202    Cardiac Enzymes: No results for input(s): CKTOTAL, CKMB, CKMBINDEX, TROPONINI in the last 168 hours.  BNP (last 3 results) No results for input(s): BNP in the last 8760 hours.  ProBNP (last 3 results) No results for input(s): PROBNP in the last 8760 hours.  Radiological Exams: No results found.  Assessment/Plan Active Problems:   Acute on chronic respiratory failure with hypoxia (HCC)   Lobar pneumonia (HCC)   Atelectasis of left lung   Severe sepsis (HCC)   Muscular dystrophy (HCC)   1. Acute on chronic respiratory failure with hypoxia continue supportive care he has been on trach collar 28% this morning and doing well.  Did fail cuff deflation, will give Xanax and try cuff deflation again in an hour or 2. 2. Lobar pneumonia treated will continue with supportive care 3. Atelectasis of lung continue aggressive pulmonary toilet and supportive care 4. Sepsis resolved 5. 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  Time spent 35 minutes discussion with the family regarding the CODE STATUS as well as  discussion about deflating the cough and potential for using the speaking valve been eating. The Patient requires high complexity decision making for assessment and support.  Case was discussed on Rounds with the Respiratory Therapy Staff  Yevonne Pax, MD [facility] Pulmonary Critical Care Medicine Sleep Medicine

## 2018-05-19 DIAGNOSIS — J9811 Atelectasis: Secondary | ICD-10-CM | POA: Diagnosis not present

## 2018-05-19 DIAGNOSIS — G7100 Muscular dystrophy, unspecified: Secondary | ICD-10-CM | POA: Diagnosis not present

## 2018-05-19 DIAGNOSIS — J90 Pleural effusion, not elsewhere classified: Secondary | ICD-10-CM

## 2018-05-19 DIAGNOSIS — J181 Lobar pneumonia, unspecified organism: Secondary | ICD-10-CM | POA: Diagnosis not present

## 2018-05-19 DIAGNOSIS — J9621 Acute and chronic respiratory failure with hypoxia: Secondary | ICD-10-CM | POA: Diagnosis not present

## 2018-05-19 NOTE — Progress Notes (Addendum)
Pulmonary Critical Care Medicine [facility] [place]   PULMONARY CRITICAL CARE SERVICE  PROGRESS NOTE  Date of Service: 05/19/2018  Joseph Hampton  [id number]  DOB: [birth year]   DOA: 04/30/2018  Referring Physician: Carron Curie, MD  HPI: Joseph Hampton is a 48 y.o. male seen for follow up of Acute on Chronic Respiratory Failure.  Patient currently doing well on 20% FiO2 trach collar.  Yesterday afternoon he was able to tolerate approximately 2 hours with his tracheal cuff deflated after having 0.25 mg of Xanax.  This morning he was again given Xanax and has had his cuff deflated ever since.  Currently he has been deflated for 4-1/2 hours without any difficulty.  He is able to cough and clear his airway at times however I am still concerned that he quite possibly could be aspirating.  Wife is at bedside and feels like the patient is doing very well.  Medications: Reviewed on Rounds  Physical Exam:  Vitals: Pulse 83 respirations 21 BP 103/76 O2 sat 99% temp 97.1  Ventilator Settings patient's not currently on ventilator  . General: Comfortable at this time . Eyes: Grossly normal lids, irises & conjunctiva . ENT: grossly tongue is normal . Neck: no obvious mass . Cardiovascular: S1 S2 normal no gallop . Respiratory: Coarse breath sounds . Abdomen: soft . Skin: no rash seen on limited exam . Musculoskeletal: not rigid . Psychiatric:unable to assess . Neurologic: no seizure no involuntary movements         Lab Data:   Basic Metabolic Panel: Recent Labs  Lab 05/13/18 1240 05/16/18 0528 05/18/18 0720  NA 138 137 136  K 4.1 4.1 4.0  CL 98 96* 95*  CO2 31 30 30   GLUCOSE 103* 94 121*  BUN 23* 22* 29*  CREATININE 0.39* 0.43* 0.35*  CALCIUM 9.7 9.2 9.8  MG  --  2.2 2.3  PHOS  --   --  4.2    ABG: No results for input(s): PHART, PCO2ART, PO2ART, HCO3, O2SAT in the last 168 hours.  Liver Function Tests: No results for input(s): AST, ALT, ALKPHOS, BILITOT,  PROT, ALBUMIN in the last 168 hours. No results for input(s): LIPASE, AMYLASE in the last 168 hours. No results for input(s): AMMONIA in the last 168 hours.  CBC: Recent Labs  Lab 05/16/18 0528 05/18/18 0720  WBC 5.9 6.2  HGB 12.5* 12.4*  HCT 38.3* 38.1*  MCV 93.2 91.4  PLT 201 202    Cardiac Enzymes: No results for input(s): CKTOTAL, CKMB, CKMBINDEX, TROPONINI in the last 168 hours.  BNP (last 3 results) No results for input(s): BNP in the last 8760 hours.  ProBNP (last 3 results) No results for input(s): PROBNP in the last 8760 hours.  Radiological Exams: No results found.  Assessment/Plan Active Problems:   Acute on chronic respiratory failure with hypoxia (HCC)   Lobar pneumonia (HCC)   Atelectasis of left lung   Severe sepsis (HCC)   Muscular dystrophy (HCC)   1. Acute on chronic respiratory failure with hypoxia continue supportive care with trach collar 20% FiO2.  Patient is doing well currently with tracheal cuff deflation after Xanax.  He is currently 4-1/2 hours into deflation. 2. Lobar pneumonia treated continue supportive care 3. Atelectasis of lung continue aggressive pulmonary toilet and supportive care 4. Sepsis resolved 5. 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  Time spent 35 minutes counseling the family as well as the patient and discussion with the treatment team and staff The Patient requires high complexity decision making for assessment and support.  Case was discussed on Rounds with the Respiratory Therapy Staff  Yevonne Pax, MD [facility] Pulmonary Critical Care Medicine Sleep Medicine

## 2018-05-20 DIAGNOSIS — J9811 Atelectasis: Secondary | ICD-10-CM | POA: Diagnosis not present

## 2018-05-20 DIAGNOSIS — J181 Lobar pneumonia, unspecified organism: Secondary | ICD-10-CM | POA: Diagnosis not present

## 2018-05-20 DIAGNOSIS — J9621 Acute and chronic respiratory failure with hypoxia: Secondary | ICD-10-CM | POA: Diagnosis not present

## 2018-05-20 DIAGNOSIS — G7100 Muscular dystrophy, unspecified: Secondary | ICD-10-CM | POA: Diagnosis not present

## 2018-05-20 NOTE — Progress Notes (Signed)
Pulmonary Critical Care Medicine [facility] [place]   PULMONARY CRITICAL CARE SERVICE  PROGRESS NOTE  Date of Service: 05/20/2018  Joseph Hampton  [id number]  DOB: 1970/08/08   DOA: 04/30/2018  Referring Physician: Carron Curie, MD  HPI: Joseph Hampton is a 48 y.o. male seen for follow up of Acute on Chronic Respiratory Failure.  Today patient looks good he had his PMV on and he is actually speaking distress is noted at this time.  Right now is on T collar with 28% FiO2  Medications: Reviewed on Rounds  Physical Exam:  Vitals: Temperature 97.7 pulse 85 respiratory 20 blood pressure 133/66 saturations 97%  Ventilator Settings patient right now is off the ventilator on T collar FiO2 28%  . General: Comfortable at this time . Eyes: Grossly normal lids, irises & conjunctiva . ENT: grossly tongue is normal . Neck: no obvious mass . Cardiovascular: S1 S2 normal no gallop . Respiratory: Scattered rhonchi expansion is equal . Abdomen: soft . Skin: no rash seen on limited exam . Musculoskeletal: not rigid . Psychiatric:unable to assess . Neurologic: no seizure no involuntary movements         Lab Data:   Basic Metabolic Panel: Recent Labs  Lab 05/16/18 0528 05/18/18 0720  NA 137 136  K 4.1 4.0  CL 96* 95*  CO2 30 30  GLUCOSE 94 121*  BUN 22* 29*  CREATININE 0.43* 0.35*  CALCIUM 9.2 9.8  MG 2.2 2.3  PHOS  --  4.2    ABG: No results for input(s): PHART, PCO2ART, PO2ART, HCO3, O2SAT in the last 168 hours.  Liver Function Tests: No results for input(s): AST, ALT, ALKPHOS, BILITOT, PROT, ALBUMIN in the last 168 hours. No results for input(s): LIPASE, AMYLASE in the last 168 hours. No results for input(s): AMMONIA in the last 168 hours.  CBC: Recent Labs  Lab 05/16/18 0528 05/18/18 0720  WBC 5.9 6.2  HGB 12.5* 12.4*  HCT 38.3* 38.1*  MCV 93.2 91.4  PLT 201 202    Cardiac Enzymes: No results for input(s): CKTOTAL, CKMB, CKMBINDEX, TROPONINI in  the last 168 hours.  BNP (last 3 results) No results for input(s): BNP in the last 8760 hours.  ProBNP (last 3 results) No results for input(s): PROBNP in the last 8760 hours.  Radiological Exams: No results found.  Assessment/Plan Active Problems:   Acute on chronic respiratory failure with hypoxia (HCC)   Lobar pneumonia (HCC)   Atelectasis of left lung   Severe sepsis (HCC)   Muscular dystrophy (HCC)   1. Acute on chronic respiratory failure with hypoxia we will continue with T collar trials titrate oxygen as tolerated continue pulmonary toilet supportive care. 2. Lobar pneumonia treated clinically improving 3. Atelectasis of the lung continue aggressive pulmonary toilet 4. Severe sepsis stable hemodynamically 5. Muscular dystrophy progressive disease we will continue present therapy   I have personally seen and evaluated the patient, evaluated laboratory and imaging results, formulated the assessment and plan and placed orders. The Patient requires high complexity decision making for assessment and support.  Case was discussed on Rounds with the Respiratory Therapy Staff  Yevonne Pax, MD [facility] Pulmonary Critical Care Medicine Sleep Medicine

## 2018-05-21 ENCOUNTER — Other Ambulatory Visit (HOSPITAL_COMMUNITY): Payer: Self-pay

## 2018-05-21 DIAGNOSIS — J181 Lobar pneumonia, unspecified organism: Secondary | ICD-10-CM | POA: Diagnosis not present

## 2018-05-21 DIAGNOSIS — J9811 Atelectasis: Secondary | ICD-10-CM | POA: Diagnosis not present

## 2018-05-21 DIAGNOSIS — J9621 Acute and chronic respiratory failure with hypoxia: Secondary | ICD-10-CM | POA: Diagnosis not present

## 2018-05-21 DIAGNOSIS — G7100 Muscular dystrophy, unspecified: Secondary | ICD-10-CM | POA: Diagnosis not present

## 2018-05-21 NOTE — Progress Notes (Signed)
Pulmonary Critical Care Medicine [facility] [place]   PULMONARY CRITICAL CARE SERVICE  PROGRESS NOTE  Date of Service: 05/21/2018  ZASHAWN CONKEY  [id number]:[id number]  DOB: 10-20-1970   DOA: 04/30/2018  Referring Physician: Carron Curie, MD  HPI: JARION PRANTE is a 48 y.o. male seen for follow up of Acute on Chronic Respiratory Failure.  Patient is doing well has been on T collar again tolerating the PMV secretions are still pulling in his mouth he has been instructed to go ahead and swallow the secretions.  We will also check with speech therapy regarding possibility of eating  Medications: Reviewed on Rounds  Physical Exam:  Vitals: Temperature 97.9 pulse 88 respiratory 22 blood pressure 108/60 saturations 99%  Ventilator Settings off the ventilator on T collar right now with the PMV in place  . General: Comfortable at this time . Eyes: Grossly normal lids, irises & conjunctiva . ENT: grossly tongue is normal . Neck: no obvious mass . Cardiovascular: S1 S2 normal no gallop . Respiratory: No rhonchi or rales are noted at this time . Abdomen: soft . Skin: no rash seen on limited exam . Musculoskeletal: not rigid . Psychiatric:unable to assess . Neurologic: no seizure no involuntary movements         Lab Data:   Basic Metabolic Panel: Recent Labs  Lab 05/16/18 0528 05/18/18 0720  NA 137 136  K 4.1 4.0  CL 96* 95*  CO2 30 30  GLUCOSE 94 121*  BUN 22* 29*  CREATININE 0.43* 0.35*  CALCIUM 9.2 9.8  MG 2.2 2.3  PHOS  --  4.2    ABG: No results for input(s): PHART, PCO2ART, PO2ART, HCO3, O2SAT in the last 168 hours.  Liver Function Tests: No results for input(s): AST, ALT, ALKPHOS, BILITOT, PROT, ALBUMIN in the last 168 hours. No results for input(s): LIPASE, AMYLASE in the last 168 hours. No results for input(s): AMMONIA in the last 168 hours.  CBC: Recent Labs  Lab 05/16/18 0528 05/18/18 0720  WBC 5.9 6.2  HGB 12.5* 12.4*  HCT 38.3* 38.1*  MCV 93.2  91.4  PLT 201 202    Cardiac Enzymes: No results for input(s): CKTOTAL, CKMB, CKMBINDEX, TROPONINI in the last 168 hours.  BNP (last 3 results) No results for input(s): BNP in the last 8760 hours.  ProBNP (last 3 results) No results for input(s): PROBNP in the last 8760 hours.  Radiological Exams: No results found.  Assessment/Plan Active Problems:   Acute on chronic respiratory failure with hypoxia (HCC)   Lobar pneumonia (HCC)   Atelectasis of left lung   Severe sepsis (HCC)   Muscular dystrophy (HCC)   1. Acute on chronic respiratory failure with hypoxia we will continue with T collar weaning as tolerated with the PMV continue aggressive pulmonary toilet. 2. Lobar pneumonia treated we will continue to follow. 3. Severe sepsis resolved hemodynamically stable. 4. Atelectasis of the lung needs ongoing aggressive pulmonary toilet 5. Muscular dystrophy progressive disease prognosis guarded   I have personally seen and evaluated the patient, evaluated laboratory and imaging results, formulated the assessment and plan and placed orders. The Patient requires high complexity decision making for assessment and support.  Case was discussed on Rounds with the Respiratory Therapy Staff  Yevonne Pax, MD [facility] Pulmonary Critical Care Medicine Sleep Medicine

## 2018-05-22 DIAGNOSIS — J9811 Atelectasis: Secondary | ICD-10-CM | POA: Diagnosis not present

## 2018-05-22 DIAGNOSIS — G7100 Muscular dystrophy, unspecified: Secondary | ICD-10-CM | POA: Diagnosis not present

## 2018-05-22 DIAGNOSIS — J181 Lobar pneumonia, unspecified organism: Secondary | ICD-10-CM | POA: Diagnosis not present

## 2018-05-22 DIAGNOSIS — J9621 Acute and chronic respiratory failure with hypoxia: Secondary | ICD-10-CM | POA: Diagnosis not present

## 2018-05-22 NOTE — Progress Notes (Signed)
Pulmonary Critical Care Medicine [facility] [place]   PULMONARY CRITICAL CARE SERVICE  PROGRESS NOTE  Date of Service: 05/22/2018  Joseph Hampton  [id number]  DOB: [birth year]   DOA: 04/30/2018  Referring Physician: Carron Curie, MD  HPI: Joseph Hampton is a 48 y.o. male seen for follow up of Acute on Chronic Respiratory Failure.  This morning patient was on his trilogy home ventilator.  He actually did not tolerate T collar very well after deflation of the cuff.  Medications: Reviewed on Rounds  Physical Exam:  Vitals: Temperature 97.8 pulse 71 respiratory 24 blood pressure 108/66 saturations 97%  Ventilator Settings on the trilogy ventilator at this time  . General: Comfortable at this time . Eyes: Grossly normal lids, irises & conjunctiva . ENT: grossly tongue is normal . Neck: no obvious mass . Cardiovascular: S1 S2 normal no gallop . Respiratory: Scattered rhonchi expansion is equal . Abdomen: soft . Skin: no rash seen on limited exam . Musculoskeletal: not rigid . Psychiatric:unable to assess . Neurologic: no seizure no involuntary movements         Lab Data:   Basic Metabolic Panel: Recent Labs  Lab 05/16/18 0528 05/18/18 0720  NA 137 136  K 4.1 4.0  CL 96* 95*  CO2 30 30  GLUCOSE 94 121*  BUN 22* 29*  CREATININE 0.43* 0.35*  CALCIUM 9.2 9.8  MG 2.2 2.3  PHOS  --  4.2    ABG: No results for input(s): PHART, PCO2ART, PO2ART, HCO3, O2SAT in the last 168 hours.  Liver Function Tests: No results for input(s): AST, ALT, ALKPHOS, BILITOT, PROT, ALBUMIN in the last 168 hours. No results for input(s): LIPASE, AMYLASE in the last 168 hours. No results for input(s): AMMONIA in the last 168 hours.  CBC: Recent Labs  Lab 05/16/18 0528 05/18/18 0720  WBC 5.9 6.2  HGB 12.5* 12.4*  HCT 38.3* 38.1*  MCV 93.2 91.4  PLT 201 202    Cardiac Enzymes: No results for input(s): CKTOTAL, CKMB, CKMBINDEX, TROPONINI in the last 168 hours.  BNP (last  3 results) No results for input(s): BNP in the last 8760 hours.  ProBNP (last 3 results) No results for input(s): PROBNP in the last 8760 hours.  Radiological Exams: No results found.  Assessment/Plan Active Problems:   Acute on chronic respiratory failure with hypoxia (HCC)   Lobar pneumonia (HCC)   Atelectasis of left lung   Severe sepsis (HCC)   Muscular dystrophy (HCC)   1. Acute on chronic respiratory failure hypoxia we will continue with ventilation with a trilogy vent respiratory therapy is going to recheck this afternoon if patient is able to come off the vent 2. Lobar pneumonia treated we will continue present management 3. Atelectasis continue with pulmonary toilet follow-up chest x-ray 4. Severe sepsis hemodynamically stable 5. Muscular dystrophy advanced disease continue with supportive care   I have personally seen and evaluated the patient, evaluated laboratory and imaging results, formulated the assessment and plan and placed orders. The Patient requires high complexity decision making for assessment and support.  Case was discussed on Rounds with the Respiratory Therapy Staff  Yevonne Pax, MD [facility] Pulmonary Critical Care Medicine Sleep Medicine

## 2018-05-23 DIAGNOSIS — G7100 Muscular dystrophy, unspecified: Secondary | ICD-10-CM | POA: Diagnosis not present

## 2018-05-23 DIAGNOSIS — J181 Lobar pneumonia, unspecified organism: Secondary | ICD-10-CM | POA: Diagnosis not present

## 2018-05-23 DIAGNOSIS — J9621 Acute and chronic respiratory failure with hypoxia: Secondary | ICD-10-CM | POA: Diagnosis not present

## 2018-05-23 DIAGNOSIS — J9811 Atelectasis: Secondary | ICD-10-CM | POA: Diagnosis not present

## 2018-05-23 NOTE — Progress Notes (Signed)
Pulmonary Critical Care Medicine [facility] [place]   PULMONARY CRITICAL CARE SERVICE  PROGRESS NOTE  Date of Service: 05/23/2018  Joseph Hampton  [id number]:[id number]  DOB: [birth year]   DOA: 04/30/2018  Referring Physician: Carron Curie, MD  HPI: Joseph Hampton is a 48 y.o. male seen for follow up of Acute on Chronic Respiratory Failure.  At this time patient is on T collar tolerating it well with the PMV in place.  He states also that he did have some lunch.  He is having some difficulty with the trilogy ventilator.  I have reviewed the settings of the trilogy ventilator and also wrote the orders to make adjustments so that it matches the.  And Bennett ventilator settings that we had him on.  Medications: Reviewed on Rounds  Physical Exam:  Vitals: Temperature 98.0 pulse 97 respiratory 21 blood pressure 121/74 saturations 96%  Ventilator Settings off the ventilator on T collar FiO2 35% tolerating PMV  . General: Comfortable at this time . Eyes: Grossly normal lids, irises & conjunctiva . ENT: grossly tongue is normal . Neck: no obvious mass . Cardiovascular: S1 S2 normal no gallop . Respiratory: No rhonchi or rales are noted at this time . Abdomen: soft . Skin: no rash seen on limited exam . Musculoskeletal: not rigid . Psychiatric:unable to assess . Neurologic: no seizure no involuntary movements         Lab Data:   Basic Metabolic Panel: Recent Labs  Lab 05/18/18 0720  NA 136  K 4.0  CL 95*  CO2 30  GLUCOSE 121*  BUN 29*  CREATININE 0.35*  CALCIUM 9.8  MG 2.3  PHOS 4.2    ABG: No results for input(s): PHART, PCO2ART, PO2ART, HCO3, O2SAT in the last 168 hours.  Liver Function Tests: No results for input(s): AST, ALT, ALKPHOS, BILITOT, PROT, ALBUMIN in the last 168 hours. No results for input(s): LIPASE, AMYLASE in the last 168 hours. No results for input(s): AMMONIA in the last 168 hours.  CBC: Recent Labs  Lab 05/18/18 0720  WBC 6.2  HGB 12.4*   HCT 38.1*  MCV 91.4  PLT 202    Cardiac Enzymes: No results for input(s): CKTOTAL, CKMB, CKMBINDEX, TROPONINI in the last 168 hours.  BNP (last 3 results) No results for input(s): BNP in the last 8760 hours.  ProBNP (last 3 results) No results for input(s): PROBNP in the last 8760 hours.  Radiological Exams: No results found.  Assessment/Plan Active Problems:   Acute on chronic respiratory failure with hypoxia (HCC)   Lobar pneumonia (HCC)   Atelectasis of left lung   Severe sepsis (HCC)   Muscular dystrophy (HCC)   1. Acute on chronic respiratory failure with hypoxia we will continue with T collar trials as tolerated rest on the ventilator at nighttime the settings for the trilogy ventilator have been adjusted as noted above 2. Lobar pneumonia clinically improved we will continue to monitor 3. Atelectasis of the lung continue aggressive pulmonary toilet 4. Severe sepsis hemodynamically stable resolved 5. Muscular dystrophy advanced severe disease prognosis guarded   I have personally seen and evaluated the patient, evaluated laboratory and imaging results, formulated the assessment and plan and placed orders. The Patient requires high complexity decision making for assessment and support.  Case was discussed on Rounds with the Respiratory Therapy Staff  Yevonne Pax, MD [employer] Pulmonary Critical Care Medicine Sleep Medicine

## 2018-05-24 ENCOUNTER — Other Ambulatory Visit (HOSPITAL_COMMUNITY): Payer: Self-pay

## 2018-05-24 DIAGNOSIS — J9621 Acute and chronic respiratory failure with hypoxia: Secondary | ICD-10-CM | POA: Diagnosis not present

## 2018-05-24 DIAGNOSIS — G7100 Muscular dystrophy, unspecified: Secondary | ICD-10-CM | POA: Diagnosis not present

## 2018-05-24 DIAGNOSIS — J181 Lobar pneumonia, unspecified organism: Secondary | ICD-10-CM | POA: Diagnosis not present

## 2018-05-24 DIAGNOSIS — J9811 Atelectasis: Secondary | ICD-10-CM | POA: Diagnosis not present

## 2018-05-24 NOTE — Progress Notes (Signed)
Pulmonary Critical Care Medicine [facility] [place]   PULMONARY CRITICAL CARE SERVICE  PROGRESS NOTE  Date of Service: 05/24/2018  Joseph Hampton  [id number]  DOB: 10/09/1970   DOA: 04/30/2018  Referring Physician: Carron Curie, MD  HPI: Joseph Hampton is a 48 y.o. male seen for follow up of Acute on Chronic Respiratory Failure.  Patient is on 35% FiO2 on T collar this morning doing better with PMV also has been tolerating feeding well  Medications: Reviewed on Rounds  Physical Exam:  Vitals: Temperature 96.0 pulse 78 respiratory 18 blood pressure 140/87 saturations 100%  Ventilator Settings off the ventilator on T collar at this time  . General: Comfortable at this time . Eyes: Grossly normal lids, irises & conjunctiva . ENT: grossly tongue is normal . Neck: no obvious mass . Cardiovascular: S1 S2 normal no gallop . Respiratory: No rhonchi or rales are noted at this time . Abdomen: soft . Skin: no rash seen on limited exam . Musculoskeletal: not rigid . Psychiatric:unable to assess . Neurologic: no seizure no involuntary movements         Lab Data:   Basic Metabolic Panel: Recent Labs  Lab 05/18/18 0720  NA 136  K 4.0  CL 95*  CO2 30  GLUCOSE 121*  BUN 29*  CREATININE 0.35*  CALCIUM 9.8  MG 2.3  PHOS 4.2    ABG: No results for input(s): PHART, PCO2ART, PO2ART, HCO3, O2SAT in the last 168 hours.  Liver Function Tests: No results for input(s): AST, ALT, ALKPHOS, BILITOT, PROT, ALBUMIN in the last 168 hours. No results for input(s): LIPASE, AMYLASE in the last 168 hours. No results for input(s): AMMONIA in the last 168 hours.  CBC: Recent Labs  Lab 05/18/18 0720  WBC 6.2  HGB 12.4*  HCT 38.1*  MCV 91.4  PLT 202    Cardiac Enzymes: No results for input(s): CKTOTAL, CKMB, CKMBINDEX, TROPONINI in the last 168 hours.  BNP (last 3 results) No results for input(s): BNP in the last 8760 hours.  ProBNP (last 3 results) No results for  input(s): PROBNP in the last 8760 hours.  Radiological Exams: No results found.  Assessment/Plan Active Problems:   Acute on chronic respiratory failure with hypoxia (HCC)   Lobar pneumonia (HCC)   Atelectasis of left lung   Severe sepsis (HCC)   Muscular dystrophy (HCC)   1. Acute on chronic respiratory failure with hypoxia we will continue with the T collar trials as tolerated continue supportive care 2. Lobar pneumonia treated clinically improved x-ray ordered 3. Atelectasis of the lung chest x-ray ordered for follow-up 4. Severe sepsis resolved 5. Muscular dystrophy progressive disease   I have personally seen and evaluated the patient, evaluated laboratory and imaging results, formulated the assessment and plan and placed orders. The Patient requires high complexity decision making for assessment and support.  Case was discussed on Rounds with the Respiratory Therapy Staff  Yevonne Pax, MD [facility] Pulmonary Critical Care Medicine Sleep Medicine

## 2018-05-25 DIAGNOSIS — J181 Lobar pneumonia, unspecified organism: Secondary | ICD-10-CM | POA: Diagnosis not present

## 2018-05-25 DIAGNOSIS — J9811 Atelectasis: Secondary | ICD-10-CM | POA: Diagnosis not present

## 2018-05-25 DIAGNOSIS — J9621 Acute and chronic respiratory failure with hypoxia: Secondary | ICD-10-CM | POA: Diagnosis not present

## 2018-05-25 DIAGNOSIS — G7100 Muscular dystrophy, unspecified: Secondary | ICD-10-CM | POA: Diagnosis not present

## 2018-05-25 NOTE — Progress Notes (Addendum)
Pulmonary Critical Care Medicine [facility] [place]   PULMONARY CRITICAL CARE SERVICE  PROGRESS NOTE  Date of Service: 05/25/2018  EZREAL MARSHBURN  SRP:[id number]  DOB: 21-Nov-1970   DOA: 04/30/2018  Referring Physician: Carron Curie, MD  HPI: Joseph Hampton is a 48 y.o. male seen for follow up of Acute on Chronic Respiratory Failure.  Patient is on 28% FiO2 on trach collar this morning and doing well.  Patient is using PMV and tolerating feedings well.  Using home vent at night.  Medications: Reviewed on Rounds  Physical Exam:  Vitals: Pulse 87 respirations 16 BP 114/58 O2 sat 93% temp 97.4  Ventilator Settings patient's not currently on ventilator  . General: Comfortable at this time . Eyes: Grossly normal lids, irises & conjunctiva . ENT: grossly tongue is normal . Neck: no obvious mass . Cardiovascular: S1 S2 normal no gallop . Respiratory: No rales or rhonchi noted . Abdomen: soft . Skin: no rash seen on limited exam . Musculoskeletal: not rigid . Psychiatric:unable to assess . Neurologic: no seizure no involuntary movements         Lab Data:   Basic Metabolic Panel: No results for input(s): NA, K, CL, CO2, GLUCOSE, BUN, CREATININE, CALCIUM, MG, PHOS in the last 168 hours.  ABG: No results for input(s): PHART, PCO2ART, PO2ART, HCO3, O2SAT in the last 168 hours.  Liver Function Tests: No results for input(s): AST, ALT, ALKPHOS, BILITOT, PROT, ALBUMIN in the last 168 hours. No results for input(s): LIPASE, AMYLASE in the last 168 hours. No results for input(s): AMMONIA in the last 168 hours.  CBC: No results for input(s): WBC, NEUTROABS, HGB, HCT, MCV, PLT in the last 168 hours.  Cardiac Enzymes: No results for input(s): CKTOTAL, CKMB, CKMBINDEX, TROPONINI in the last 168 hours.  BNP (last 3 results) No results for input(s): BNP in the last 8760 hours.  ProBNP (last 3 results) No results for input(s): PROBNP in the last 8760 hours.  Radiological  Exams: Dg Chest Port 1 View  Result Date: 05/24/2018 CLINICAL DATA:  Flu symptoms. Clinical concern for the possibility of pneumonia. EXAM: PORTABLE CHEST 1 VIEW COMPARISON:  05/16/2018. FINDINGS: Tracheostomy tube in satisfactory position. Stable elevated left hemidiaphragm. Mild increase in gas in the colon stomach beneath the left hemidiaphragm. Colon interposition on the right with gas in normal caliber hepatic flexure. Poor inspiration with grossly normal sized heart. No significant change in left basilar atelectasis and interval mild increase in right basilar atelectasis. Unremarkable bones. IMPRESSION: 1. Poor inspiration with mild increase in right basilar atelectasis. 2. Stable left basilar atelectasis and elevated left hemidiaphragm. Electronically Signed   By: Beckie Salts M.D.   On: 05/24/2018 13:33    Assessment/Plan Active Problems:   Acute on chronic respiratory failure with hypoxia (HCC)   Lobar pneumonia (HCC)   Atelectasis of left lung   Severe sepsis (HCC)   Muscular dystrophy (HCC)   1. Acute on chronic respiratory failure with hypoxia continue with trach collar trials as tolerated continue supportive care 2. Lobar pneumonia treated clinically improved 3. Atelectasis of lung stable 4. Severe sepsis resolved 5. Muscular dystrophy progressive disease   I have personally seen and evaluated the patient, evaluated laboratory and imaging results, formulated the assessment and plan and placed orders. The Patient requires high complexity decision making for assessment and support.  Case was discussed on Rounds with the Respiratory Therapy Staff  Yevonne Pax, MD [facility] Pulmonary Critical Care Medicine Sleep Medicine

## 2018-05-26 DIAGNOSIS — G7100 Muscular dystrophy, unspecified: Secondary | ICD-10-CM | POA: Diagnosis not present

## 2018-05-26 DIAGNOSIS — J181 Lobar pneumonia, unspecified organism: Secondary | ICD-10-CM | POA: Diagnosis not present

## 2018-05-26 DIAGNOSIS — J9811 Atelectasis: Secondary | ICD-10-CM | POA: Diagnosis not present

## 2018-05-26 DIAGNOSIS — J9621 Acute and chronic respiratory failure with hypoxia: Secondary | ICD-10-CM | POA: Diagnosis not present

## 2018-05-26 NOTE — Progress Notes (Addendum)
Pulmonary Critical Care Medicine [facility] [place]   PULMONARY CRITICAL CARE SERVICE  PROGRESS NOTE  Date of Service: 05/26/2018  Joseph Hampton  CVK:[id number]  DOB: 12/15/1970   DOA: 04/30/2018  Referring Physician: Carron Curie, MD  HPI: Joseph Hampton is a 48 y.o. male seen for follow up of Acute on Chronic Respiratory Failure.  Patient is doing well using Passy-Muir valve intermittently throughout the day.  He remains on trach collar at 28% FiO2 during the day.  He is hooked up to home vent at night.  His wife is doing all of his switching and seems to be very comfortable at this time after teachings.  Medications: Reviewed on Rounds  Physical Exam:  Vitals: Pulse 86 respirations 20 BP 109/70 O2 sat 96% temp 97.8  Ventilator Settings patient's not currently on ventilator  . General: Comfortable at this time . Eyes: Grossly normal lids, irises & conjunctiva . ENT: grossly tongue is normal . Neck: no obvious mass . Cardiovascular: S1 S2 normal no gallop . Respiratory: No rales or rhonchi noted . Abdomen: soft . Skin: no rash seen on limited exam . Musculoskeletal: not rigid . Psychiatric:unable to assess . Neurologic: no seizure no involuntary movements         Lab Data:   Basic Metabolic Panel: No results for input(s): NA, K, CL, CO2, GLUCOSE, BUN, CREATININE, CALCIUM, MG, PHOS in the last 168 hours.  ABG: No results for input(s): PHART, PCO2ART, PO2ART, HCO3, O2SAT in the last 168 hours.  Liver Function Tests: No results for input(s): AST, ALT, ALKPHOS, BILITOT, PROT, ALBUMIN in the last 168 hours. No results for input(s): LIPASE, AMYLASE in the last 168 hours. No results for input(s): AMMONIA in the last 168 hours.  CBC: No results for input(s): WBC, NEUTROABS, HGB, HCT, MCV, PLT in the last 168 hours.  Cardiac Enzymes: No results for input(s): CKTOTAL, CKMB, CKMBINDEX, TROPONINI in the last 168 hours.  BNP (last 3 results) No results for  input(s): BNP in the last 8760 hours.  ProBNP (last 3 results) No results for input(s): PROBNP in the last 8760 hours.  Radiological Exams: No results found.  Assessment/Plan Active Problems:   Acute on chronic respiratory failure with hypoxia (HCC)   Lobar pneumonia (HCC)   Atelectasis of left lung   Severe sepsis (HCC)   Muscular dystrophy (HCC)   1. Acute on chronic respiratory failure with hypoxia continue trach collar trials as tolerated and continue supportive care.  Patient and wife are expecting discharge tomorrow. 2. Lobar pneumonia treated clinically improved 3. Atelectasis of lung stable 4. Severe sepsis resolved 5. Muscular dystrophy progressive disease   I have personally seen and evaluated the patient, evaluated laboratory and imaging results, formulated the assessment and plan and placed orders. The Patient requires high complexity decision making for assessment and support.  Case was discussed on Rounds with the Respiratory Therapy Staff  Yevonne Pax, MD [facility] Pulmonary Critical Care Medicine Sleep Medicine

## 2018-06-12 LAB — FUNGUS CULTURE RESULT

## 2018-06-12 LAB — FUNGUS CULTURE WITH STAIN

## 2018-06-12 LAB — FUNGAL ORGANISM REFLEX

## 2018-06-26 LAB — ACID FAST CULTURE WITH REFLEXED SENSITIVITIES: ACID FAST CULTURE - AFSCU3: NEGATIVE

## 2018-06-26 LAB — ACID FAST CULTURE WITH REFLEXED SENSITIVITIES (MYCOBACTERIA)

## 2018-07-17 DEATH — deceased

## 2020-06-09 IMAGING — DX DG ABDOMEN 1V
1 series · 1 of 1 positions shown · non-contrast
Comparison: 12/30/2014

CLINICAL DATA: Peg tube placement

EXAM:
ABDOMEN - 1 VIEW

[abdomen kub]
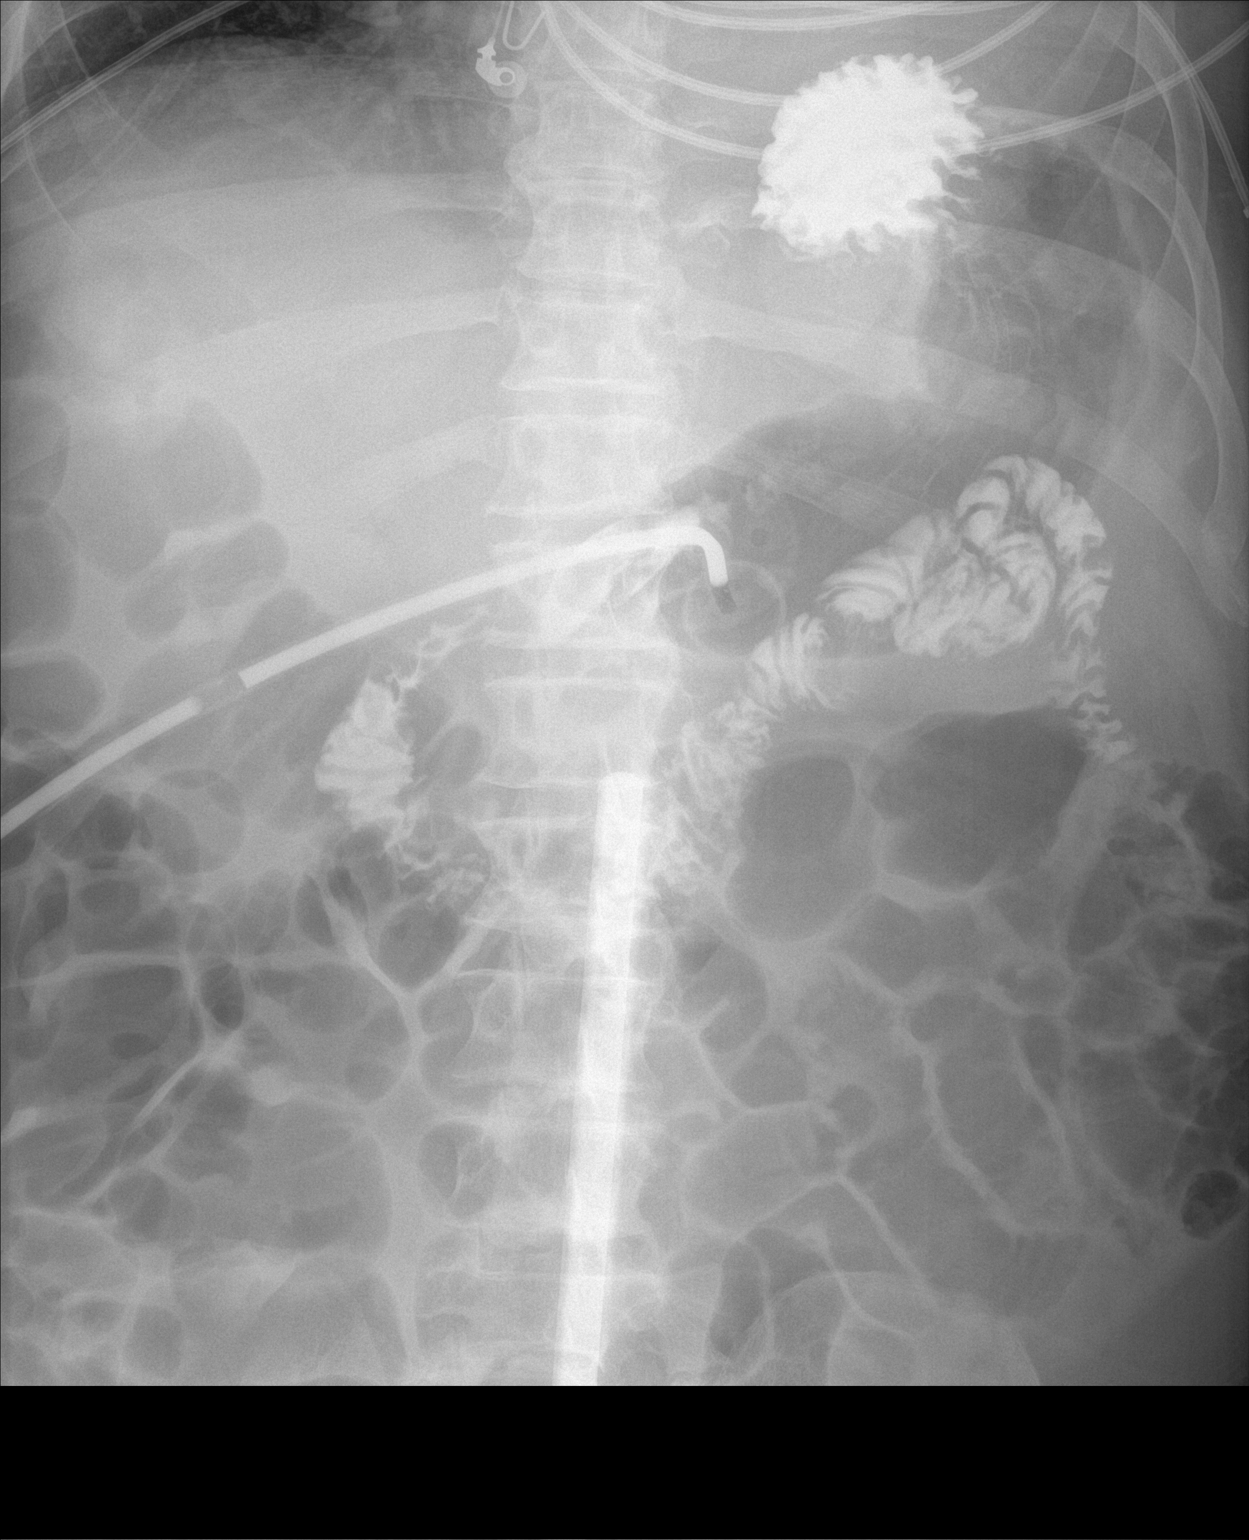

[1 of 1 positions shown; findings below may reference images not displayed]

FINDINGS: Contrast was injected through the gastrostomy tube and an image was
obtained.

Contrast opacifies the gastric lumen, duodenal sweep and proximal
jejunal loops.

Intragastric position of the PEG tube is confirmed.

No contrast extravasation.
IMPRESSION: Peg tube is located within the distal stomach.

## 2020-06-10 IMAGING — DX DG CHEST 1V PORT
1 series · 1 of 1 positions shown · non-contrast
Comparison: None.

CLINICAL DATA: Respiratory failure

EXAM:
PORTABLE CHEST 1 VIEW

[chest]
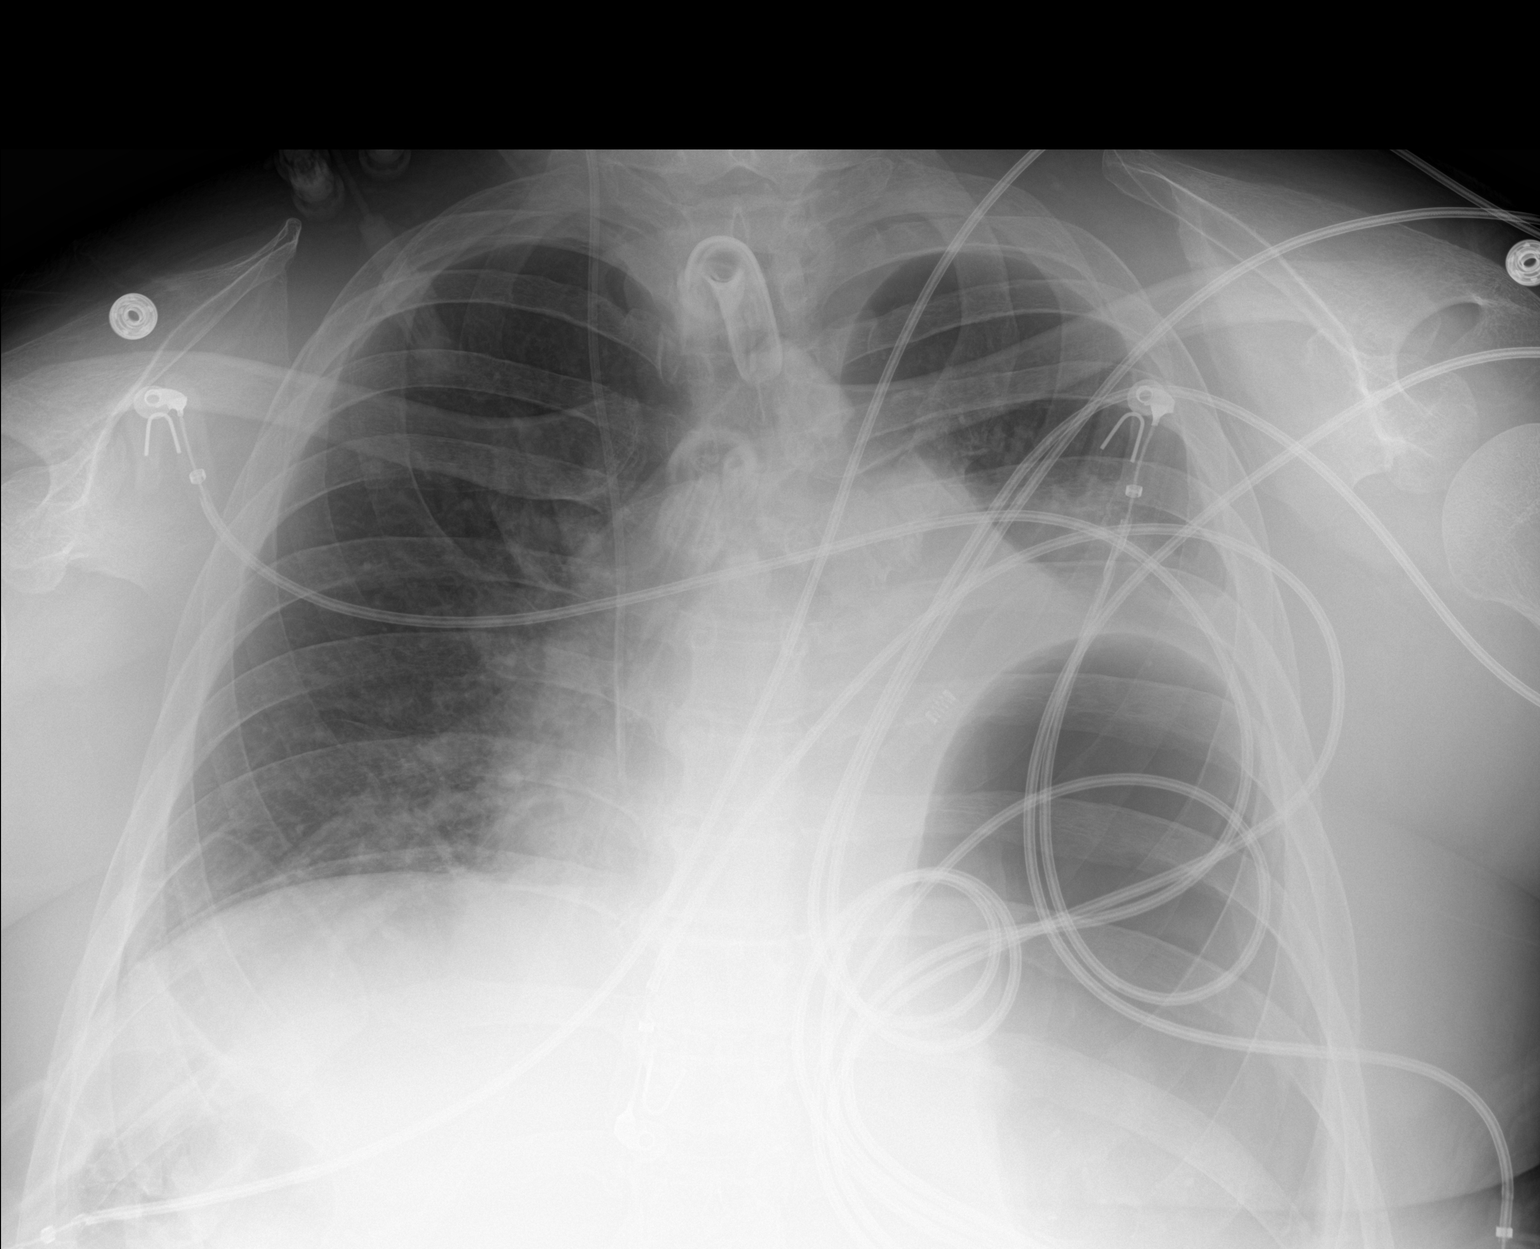

[1 of 1 positions shown; findings below may reference images not displayed]

FINDINGS: Tracheostomy tube in place. There is extensive artifact from
hardware. Right IJ line with tip at the upper right atrium.

Low volume chest with marked elevation of the left diaphragm with
subjacent gas distended stomach (stomach based on KUB yesterday).
Haziness at the left base. No visible pneumothorax.
Cardiopericardial enlargement.
IMPRESSION: 1. Low volume chest with asymmetric volume loss on the left where
there could be lobar collapse. On outside chest CT 04/27/2018 there
was left lower lobe collapse.
2. Cardiopericardial enlargement.
3. Gaseous distention of the stomach.

## 2020-06-15 IMAGING — DX DG CHEST 1V PORT
1 series · 1 of 1 positions shown · non-contrast
Comparison: One-view chest x-ray 05/01/2018

CLINICAL DATA: Collapse of left lung.

EXAM:
PORTABLE CHEST 1 VIEW

[chest]
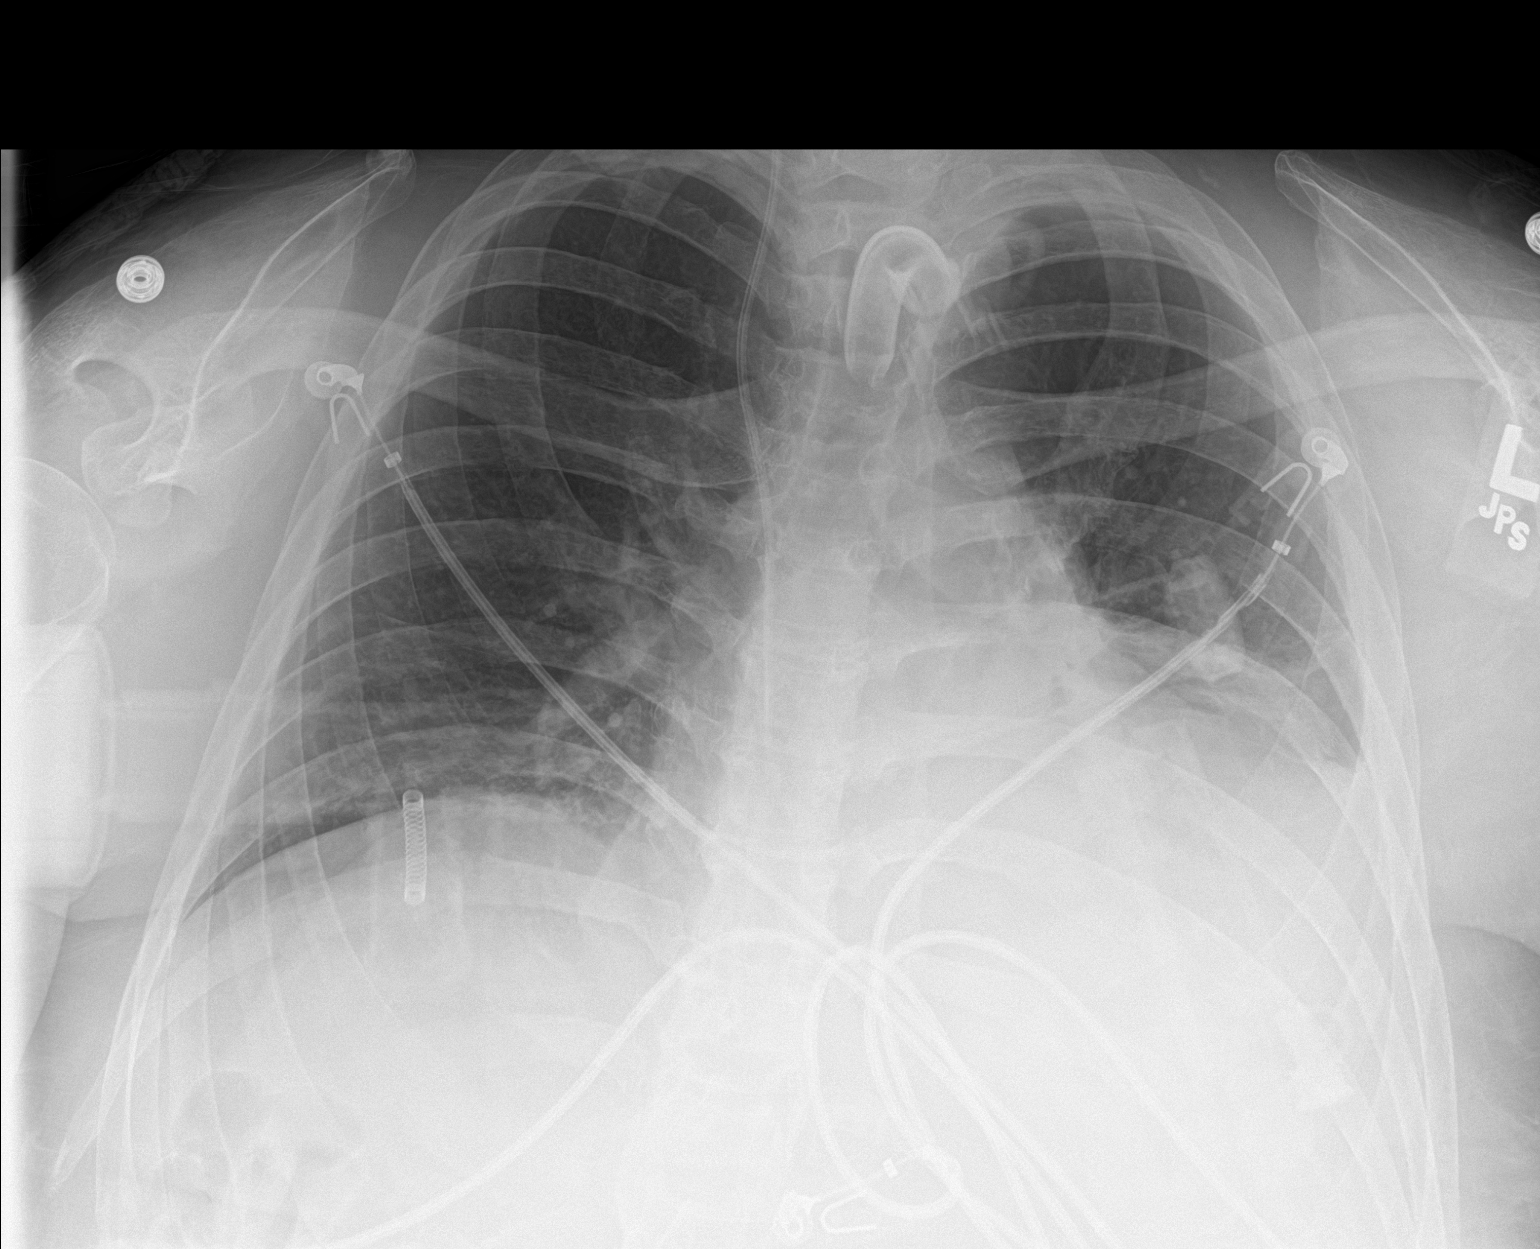

[1 of 1 positions shown; findings below may reference images not displayed]

FINDINGS: Heart is enlarged. Tracheostomy tube is in place. Right IJ line is
in place.

There is improving aeration at the left base. Gastric or colonic
distention previously noted has resolved. Left basilar airspace
disease, likely atelectasis is improved. The right lung base is
clear.
IMPRESSION: 1. Improving aeration at the left base with decreased elevation of
left hemidiaphragm.
2. Persistent but decreasing left base atelectasis and effusion.

## 2020-06-15 IMAGING — DX DG ABD PORTABLE 1V
1 series · 1 of 1 positions shown · non-contrast
Comparison: 05/04/2018

CLINICAL DATA: Ileus

EXAM:
PORTABLE ABDOMEN - 1 VIEW

[abdomen]
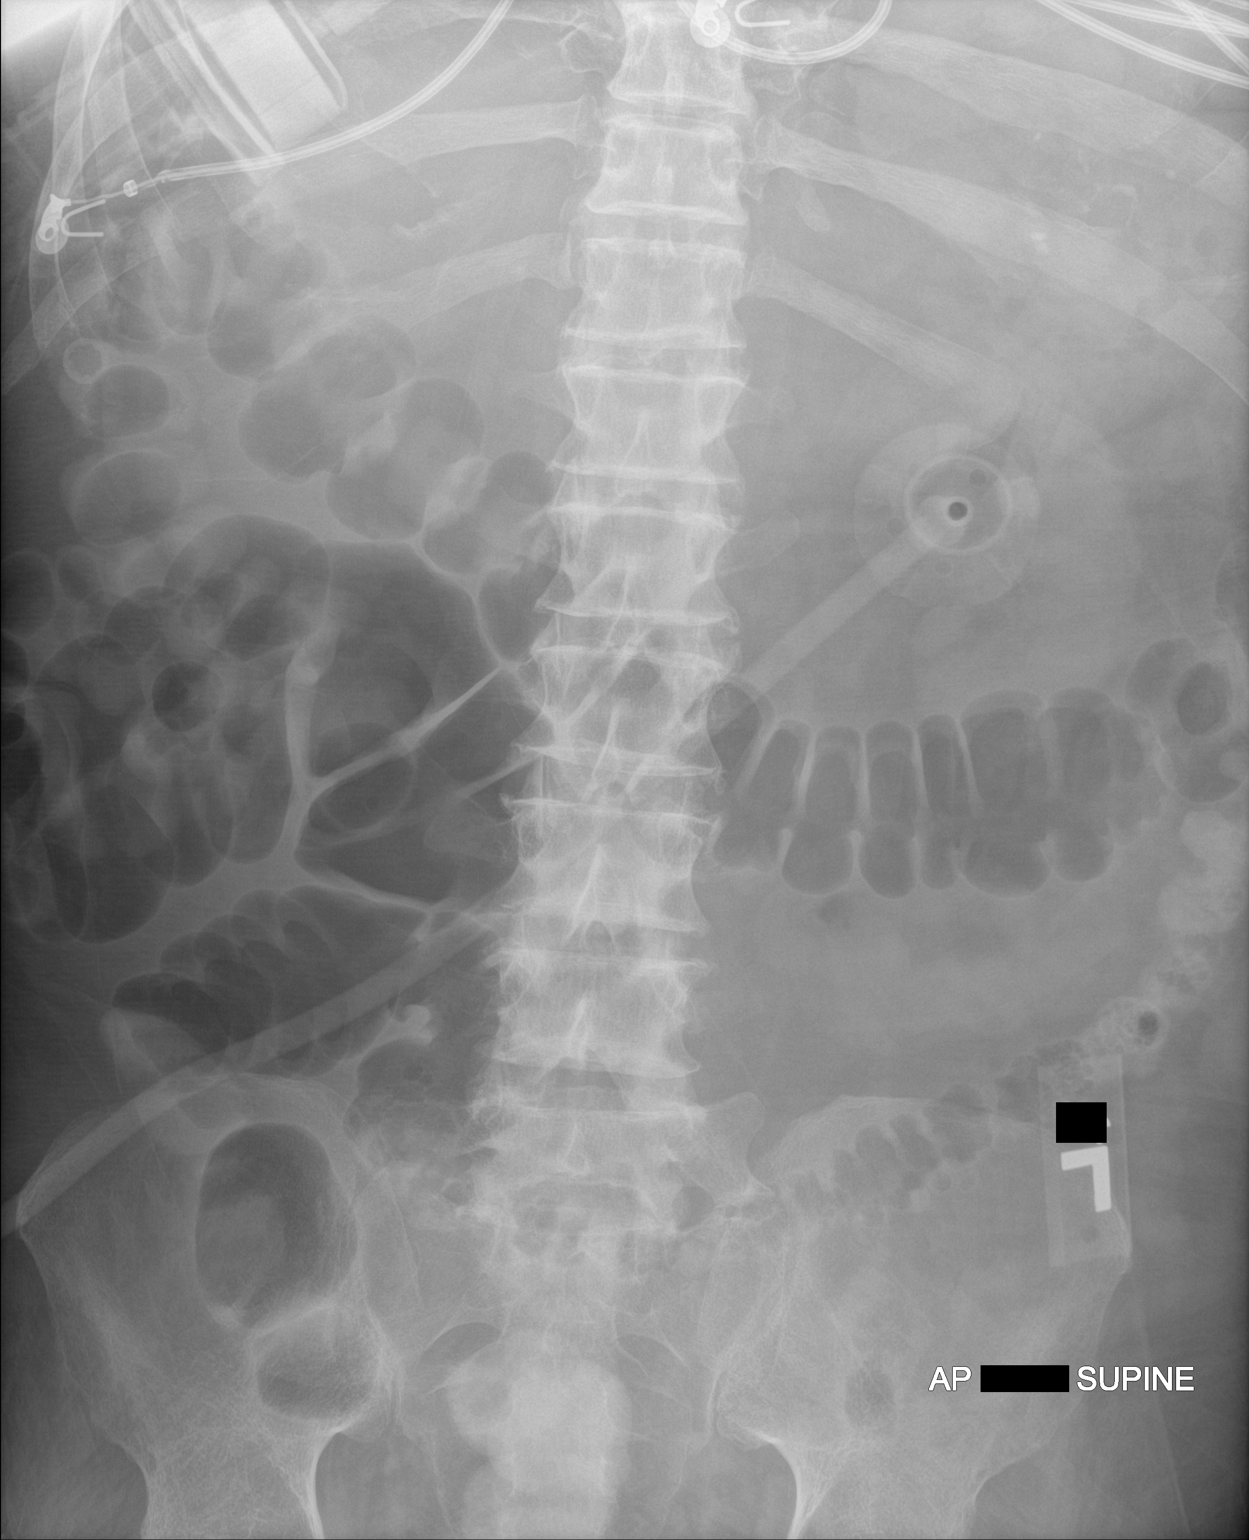

[1 of 1 positions shown; findings below may reference images not displayed]

FINDINGS: Gastrostomy tube noted. There is improvement in the air distension
of the small bowel. Colonic air distention remains, also slightly
less. Sigmoid diverticulosis noted. Negative for obstruction. No
abnormal calcifications. Degenerative changes spine.
IMPRESSION: Improvement in gaseous distention of bowel compatible resolving
ileus.
